# Patient Record
Sex: Female | Born: 1980 | Race: White | Hispanic: No | Marital: Married | Smoking: Never smoker
Health system: Southern US, Community
[De-identification: ages and names within clinical notes are randomized; demographics above are authoritative.]

## PROBLEM LIST (undated history)

## (undated) DIAGNOSIS — N920 Excessive and frequent menstruation with regular cycle: Secondary | ICD-10-CM

## (undated) DIAGNOSIS — Z9109 Other allergy status, other than to drugs and biological substances: Secondary | ICD-10-CM

## (undated) DIAGNOSIS — F53 Postpartum depression: Secondary | ICD-10-CM

## (undated) DIAGNOSIS — D649 Anemia, unspecified: Secondary | ICD-10-CM

## (undated) DIAGNOSIS — D259 Leiomyoma of uterus, unspecified: Secondary | ICD-10-CM

## (undated) DIAGNOSIS — E785 Hyperlipidemia, unspecified: Secondary | ICD-10-CM

## (undated) DIAGNOSIS — K219 Gastro-esophageal reflux disease without esophagitis: Secondary | ICD-10-CM

## (undated) DIAGNOSIS — F3281 Premenstrual dysphoric disorder: Secondary | ICD-10-CM

## (undated) DIAGNOSIS — F419 Anxiety disorder, unspecified: Secondary | ICD-10-CM

## (undated) DIAGNOSIS — T7421XA Adult sexual abuse, confirmed, initial encounter: Secondary | ICD-10-CM

## (undated) DIAGNOSIS — T8859XA Other complications of anesthesia, initial encounter: Secondary | ICD-10-CM

## (undated) DIAGNOSIS — F32A Depression, unspecified: Secondary | ICD-10-CM

## (undated) DIAGNOSIS — G43909 Migraine, unspecified, not intractable, without status migrainosus: Secondary | ICD-10-CM

## (undated) HISTORY — DX: Other allergy status, other than to drugs and biological substances: Z91.09

## (undated) HISTORY — DX: Gastro-esophageal reflux disease without esophagitis: K21.9

## (undated) HISTORY — PX: SEPTOPLASTY: SUR1290

## (undated) HISTORY — PX: WRIST SURGERY: SHX841

---

## 2006-03-09 DIAGNOSIS — R87629 Unspecified abnormal cytological findings in specimens from vagina: Secondary | ICD-10-CM

## 2006-03-09 HISTORY — DX: Unspecified abnormal cytological findings in specimens from vagina: R87.629

## 2009-07-25 ENCOUNTER — Ambulatory Visit: Payer: Self-pay | Admitting: Pediatrics

## 2013-05-16 ENCOUNTER — Ambulatory Visit: Payer: Self-pay | Admitting: Family Medicine

## 2014-06-14 ENCOUNTER — Ambulatory Visit: Admit: 2014-06-14 | Disposition: A | Discharge status: Home / Self Care | Payer: Self-pay

## 2016-02-06 ENCOUNTER — Ambulatory Visit (INDEPENDENT_AMBULATORY_CARE_PROVIDER_SITE_OTHER): Payer: 59 | Admitting: Obstetrics and Gynecology

## 2016-02-06 ENCOUNTER — Encounter: Payer: Self-pay | Admitting: Obstetrics and Gynecology

## 2016-02-06 VITALS — BP 134/84 | HR 73 | Ht 65.0 in | Wt 169.3 lb

## 2016-02-06 DIAGNOSIS — Z22330 Carrier of Group B streptococcus: Secondary | ICD-10-CM

## 2016-02-06 DIAGNOSIS — E663 Overweight: Secondary | ICD-10-CM | POA: Insufficient documentation

## 2016-02-06 DIAGNOSIS — Z842 Family history of other diseases of the genitourinary system: Secondary | ICD-10-CM

## 2016-02-06 DIAGNOSIS — Z8742 Personal history of other diseases of the female genital tract: Secondary | ICD-10-CM | POA: Insufficient documentation

## 2016-02-06 DIAGNOSIS — Z30432 Encounter for removal of intrauterine contraceptive device: Secondary | ICD-10-CM

## 2016-02-06 MED ORDER — CONCEPT DHA 53.5-38-1 MG PO CAPS: 53.5000 mg | ORAL_CAPSULE | Freq: Every day | ORAL | 3 refills | Status: DC

## 2016-02-06 NOTE — Progress Notes (Signed)
GYN ENCOUNTER NOTE  Subjective:       Sheila Baker is a 35 y.o. [id number] female is here for gynecologic evaluation of the following issues:  1. Establish care 2. IUD removal  Desires conception. History of dysmenorrhea and menorrhagia. Dysmenorrhea is characterized with central pelvic pain as well as low back pain and radiation into the buttocks and thighs History of dyspareunia   Family history of endometriosis in grandmother  Past OB history notable for 2 spontaneous vaginal deliveries. G1-PROM at 36 weeks; 6-1/2 pound female G2-SVD at 39 weeks; 8 lbs. 9 oz. female Both pregnancies, complicated by GBS positive status   Gynecologic History No LMP recorded (lmp unknown). Contraception: IUD Last Pap: No abnormals Last mammogram: N/A  Obstetric History OB History  Gravida Para Term Preterm AB Living  2 2 2     2   SAB TAB Ectopic Multiple Live Births          2    # Outcome Date GA Lbr Len/2nd Weight Sex Delivery Anes PTL Lv  2 Term 2013   8 lb 14.4 oz (4.037 kg) M Vag-Spont   LIV  1 Term 2011   6 lb 8 oz (2.948 kg) F Vag-Spont   LIV      Past Medical History:  Diagnosis Date  . Acid reflux   . Environmental allergies     Past Surgical History:  Procedure Laterality Date  . SEPTOPLASTY    . WRIST SURGERY      No current outpatient prescriptions on file prior to visit.   No current facility-administered medications on file prior to visit.     Allergies  Allergen Reactions  . Shellfish Allergy Swelling    Swelling of mouth  . Gluten Meal Diarrhea  . Sulfa Antibiotics Other (See Comments)    Fatigue    Social History   Social History  . Marital status: Married    Spouse name: N/A  . Number of children: N/A  . Years of education: N/A   Occupational History  . Not on file.   Social History Main Topics  . Smoking status: Never Smoker  . Smokeless tobacco: Never Used  . Alcohol use Yes     Comment: occas  . Drug use: No  . Sexual activity: Yes   Birth control/ protection: IUD   Other Topics Concern  . Not on file   Social History Narrative  . No narrative on file    Family History  Problem Relation Age of Onset  . Diabetes Maternal Grandmother   . Heart disease Maternal Grandmother   . Heart disease Maternal Grandfather   . Breast cancer Paternal Grandmother   . Ovarian cancer Neg Hx   . Colon cancer Neg Hx     The following portions of the patient's history were reviewed and updated as appropriate: allergies, current medications, past family history, past medical history, past social history, past surgical history and problem list.  Review of Systems Review of Systems - per history of present illness Objective:   BP 134/84   Pulse 73   Ht 5\' 5"  (1.651 m)   Wt 169 lb 4.8 oz (76.8 kg)   LMP  (LMP Unknown)   BMI 28.17 kg/m  CONSTITUTIONAL: Well-developed, well-nourished female in no acute distress.  HENT:  Normocephalic, atraumatic.  NECK: Not examined SKIN: Skin is warm and dry. No rash noted. Not diaphoretic. No erythema. No pallor. NEUROLGIC: Alert and oriented to person, place, and time. PSYCHIATRIC: Normal mood and  affect. Normal behavior. Normal judgment and thought content. CARDIOVASCULAR:Not Examined RESPIRATORY: Not Examined BREASTS: Not Examined ABDOMEN: Soft, non distended; Non tender.  No Organomegaly. PELVIC:  External Genitalia: Normal  BUS: Normal  Vagina: Normal  Cervix: Normal; Parous; no cervical motion tenderness; IUD string identified  Uterus: Normal size, shape,consistency, mobile, anteverted, nontender  Adnexa: Normal; nonpalpable and nontender  RV: Normal external exam  Bladder: Nontender MUSCULOSKELETAL: Normal range of motion. No tenderness.  No cyanosis, clubbing, or edema.     Assessment:   1. Encounter for removal of intrauterine contraceptive device (IUD)  2. Overweight (BMI 25.0-29.9)  3. History of dysmenorrhea  4. Family history of endometriosis  5. H/O female  dyspareunia     Plan:   1. Mirena IUD is removed 2. Begin prenatal vitamins 3. Consider attempting conception after at least 1 normal menstrual cycle following IUD removal 4. Healthy eating with exercises encouraged 5. Return for annual exam in 6 months  A total of 30 minutes were spent face-to-face with the patient during the encounter with greater than 50% dealing with counseling and coordination of care.  Herold HarmsMartin A Defrancesco, MD  Note: This dictation was prepared with Dragon dictation along with smaller phrase technology. Any transcriptional errors that result from this process are unintentional.

## 2016-02-06 NOTE — Patient Instructions (Signed)
1. IUD is removed today 2. Begin prenatal vitamins 3. Consider attempting conception after at least 1 normal menstrual cycle following IUD removal 4. Encourage healthy eating with exercise

## 2016-04-16 ENCOUNTER — Telehealth: Payer: Self-pay | Admitting: Obstetrics and Gynecology

## 2016-04-16 ENCOUNTER — Encounter: Payer: Self-pay | Admitting: Obstetrics and Gynecology

## 2016-04-16 ENCOUNTER — Ambulatory Visit (INDEPENDENT_AMBULATORY_CARE_PROVIDER_SITE_OTHER): Payer: 59

## 2016-04-16 ENCOUNTER — Ambulatory Visit (INDEPENDENT_AMBULATORY_CARE_PROVIDER_SITE_OTHER): Payer: 59 | Admitting: Obstetrics and Gynecology

## 2016-04-16 VITALS — BP 136/70 | HR 73 | Ht 63.0 in | Wt 168.4 lb

## 2016-04-16 DIAGNOSIS — N939 Abnormal uterine and vaginal bleeding, unspecified: Secondary | ICD-10-CM

## 2016-04-16 DIAGNOSIS — R102 Pelvic and perineal pain unspecified side: Secondary | ICD-10-CM

## 2016-04-16 DIAGNOSIS — O209 Hemorrhage in early pregnancy, unspecified: Secondary | ICD-10-CM

## 2016-04-16 DIAGNOSIS — L292 Pruritus vulvae: Secondary | ICD-10-CM

## 2016-04-16 DIAGNOSIS — N912 Amenorrhea, unspecified: Secondary | ICD-10-CM

## 2016-04-16 LAB — POCT URINALYSIS DIPSTICK
Bilirubin, UA: NEGATIVE
Blood, UA: NEGATIVE
Glucose, UA: NEGATIVE
Ketones, UA: NEGATIVE
LEUKOCYTES UA: NEGATIVE
NITRITE UA: NEGATIVE
PH UA: 6
PROTEIN UA: NEGATIVE
Spec Grav, UA: <=1.005
UROBILINOGEN UA: NEGATIVE

## 2016-04-16 LAB — POCT URINE PREGNANCY: PREG TEST UR: POSITIVE — AB

## 2016-04-16 NOTE — Patient Instructions (Addendum)
Minor Illnesses and Medications in Pregnancy  Cold/Flu:  Sudafed for congestion- Robitussin (plain) for cough- Tylenol for discomfort.  Please follow the directions on the label.  Try not to take any more than needed.  OTC Saline nasal spray and air humidifier or cool-mist  Vaporizer to sooth nasal irritation and to loosen congestion.  It is also important to increase intake of non carbonated fluids, especially if you have a fever.  Constipation:  Colace-2 capsules at bedtime; Metamucil- follow directions on label; Senokot- 1 tablet at bedtime.  Any one of these medications can be used.  It is also very important to increase fluids and fruits along with regular exercise.  If problem persists please call the office.  Diarrhea:  Kaopectate as directed on the label.  Eat a bland diet and increase fluids.  Avoid highly seasoned foods.  Headache:  Tylenol 1 or 2 tablets every 3-4 hours as needed  Indigestion:  Maalox, Mylanta, Tums or Rolaids- as directed on label.  Also try to eat small meals and avoid fatty, greasy or spicy foods.  Nausea with or without Vomiting:  Nausea in pregnancy is caused by increased levels of hormones in the body which influence the digestive system and cause irritation when stomach acids accumulate.  Symptoms usually subside after 1st trimester of pregnancy.  Try the following: 1. Keep saltines, graham crackers or dry toast by your bed to eat upon awakening. 2. Don't let your stomach get empty.  Try to eat 5-6 small meals per day instead of 3 large ones. 3. Avoid greasy fatty or highly seasoned foods.  4. Take OTC Unisom 1 tablet at bed time along with OTC Vitamin B6 25-50 mg 3 times per day.    If nausea continues with vomiting and you are unable to keep down food and fluids you may need a prescription medication.  Please notify your provider.   Sore throat:  Chloraseptic spray, throat lozenges and or plain Tylenol.  Vaginal Yeast Infection:  OTC Monistat for 7 days as  directed on label.  If symptoms do not resolve within a week notify provider.  If any of the above problems do not subside with recommended treatment please call the office for further assistance.   Do not take Aspirin, Advil, Motrin or Ibuprofen.  * * OTC= Over the counter First Trimester of Pregnancy The first trimester of pregnancy is from week 1 until the end of week 12 (months 1 through 3). A week after a sperm fertilizes an egg, the egg will implant on the wall of the uterus. This embryo will begin to develop into a baby. Genes from you and your partner are forming the baby. The female genes determine whether the baby is a boy or a girl. At 6-8 weeks, the eyes and face are formed, and the heartbeat can be seen on ultrasound. At the end of 12 weeks, all the baby's organs are formed.  Now that you are pregnant, you will want to do everything you can to have a healthy baby. Two of the most important things are to get good prenatal care and to follow your health care provider's instructions. Prenatal care is all the medical care you receive before the baby's birth. This care will help prevent, find, and treat any problems during the pregnancy and childbirth. BODY CHANGES Your body goes through many changes during pregnancy. The changes vary from woman to woman.   You may gain or lose a couple of pounds at first.  You  may feel sick to your stomach (nauseous) and throw up (vomit). If the vomiting is uncontrollable, call your health care provider.  You may tire easily.  You may develop headaches that can be relieved by medicines approved by your health care provider.  You may urinate more often. Painful urination may mean you have a bladder infection.  You may develop heartburn as a result of your pregnancy.  You may develop constipation because certain hormones are causing the muscles that push waste through your intestines to slow down.  You may develop hemorrhoids or swollen, bulging veins  (varicose veins).  Your breasts may begin to grow larger and become tender. Your nipples may stick out more, and the tissue that surrounds them (areola) may become darker.  Your gums may bleed and may be sensitive to brushing and flossing.  Dark spots or blotches (chloasma, mask of pregnancy) may develop on your face. This will likely fade after the baby is born.  Your menstrual periods will stop.  You may have a loss of appetite.  You may develop cravings for certain kinds of food.  You may have changes in your emotions from day to day, such as being excited to be pregnant or being concerned that something may go wrong with the pregnancy and baby.  You may have more vivid and strange dreams.  You may have changes in your hair. These can include thickening of your hair, rapid growth, and changes in texture. Some women also have hair loss during or after pregnancy, or hair that feels dry or thin. Your hair will most likely return to normal after your baby is born. WHAT TO EXPECT AT YOUR PRENATAL VISITS During a routine prenatal visit:  You will be weighed to make sure you and the baby are growing normally.  Your blood pressure will be taken.  Your abdomen will be measured to track your baby's growth.  The fetal heartbeat will be listened to starting around week 10 or 12 of your pregnancy.  Test results from any previous visits will be discussed. Your health care provider may ask you:  How you are feeling.  If you are feeling the baby move.  If you have had any abnormal symptoms, such as leaking fluid, bleeding, severe headaches, or abdominal cramping.  If you are using any tobacco products, including cigarettes, chewing tobacco, and electronic cigarettes.  If you have any questions. Other tests that may be performed during your first trimester include:  Blood tests to find your blood type and to check for the presence of any previous infections. They will also be used to  check for low iron levels (anemia) and Rh antibodies. Later in the pregnancy, blood tests for diabetes will be done along with other tests if problems develop.  Urine tests to check for infections, diabetes, or protein in the urine.  An ultrasound to confirm the proper growth and development of the baby.  An amniocentesis to check for possible genetic problems.  Fetal screens for spina bifida and Down syndrome.  You may need other tests to make sure you and the baby are doing well.  HIV (human immunodeficiency virus) testing. Routine prenatal testing includes screening for HIV, unless you choose not to have this test. HOME CARE INSTRUCTIONS  Medicines   Follow your health care provider's instructions regarding medicine use. Specific medicines may be either safe or unsafe to take during pregnancy.  Take your prenatal vitamins as directed.  If you develop constipation, try taking a stool  softener if your health care provider approves. Diet   Eat regular, well-balanced meals. Choose a variety of foods, such as meat or vegetable-based protein, fish, milk and low-fat dairy products, vegetables, fruits, and whole grain breads and cereals. Your health care provider will help you determine the amount of weight gain that is right for you.  Avoid raw meat and uncooked cheese. These carry germs that can cause birth defects in the baby.  Eating four or five small meals rather than three large meals a day may help relieve nausea and vomiting. If you start to feel nauseous, eating a few soda crackers can be helpful. Drinking liquids between meals instead of during meals also seems to help nausea and vomiting.  If you develop constipation, eat more high-fiber foods, such as fresh vegetables or fruit and whole grains. Drink enough fluids to keep your urine clear or pale yellow. Activity and Exercise   Exercise only as directed by your health care provider. Exercising will help you:  Control your  weight.  Stay in shape.  Be prepared for labor and delivery.  Experiencing pain or cramping in the lower abdomen or low back is a good sign that you should stop exercising. Check with your health care provider before continuing normal exercises.  Try to avoid standing for long periods of time. Move your legs often if you must stand in one place for a long time.  Avoid heavy lifting.  Wear low-heeled shoes, and practice good posture.  You may continue to have sex unless your health care provider directs you otherwise. Relief of Pain or Discomfort   Wear a good support bra for breast tenderness.   Take warm sitz baths to soothe any pain or discomfort caused by hemorrhoids. Use hemorrhoid cream if your health care provider approves.   Rest with your legs elevated if you have leg cramps or low back pain.  If you develop varicose veins in your legs, wear support hose. Elevate your feet for 15 minutes, 3-4 times a day. Limit salt in your diet. Prenatal Care   Schedule your prenatal visits by the twelfth week of pregnancy. They are usually scheduled monthly at first, then more often in the last 2 months before delivery.  Write down your questions. Take them to your prenatal visits.  Keep all your prenatal visits as directed by your health care provider. Safety   Wear your seat belt at all times when driving.  Make a list of emergency phone numbers, including numbers for family, friends, the hospital, and police and fire departments. General Tips   Ask your health care provider for a referral to a local prenatal education class. Begin classes no later than at the beginning of month 6 of your pregnancy.  Ask for help if you have counseling or nutritional needs during pregnancy. Your health care provider can offer advice or refer you to specialists for help with various needs.  Do not use hot tubs, steam rooms, or saunas.  Do not douche or use tampons or scented sanitary  pads.  Do not cross your legs for long periods of time.  Avoid cat litter boxes and soil used by cats. These carry germs that can cause birth defects in the baby and possibly loss of the fetus by miscarriage or stillbirth.  Avoid all smoking, herbs, alcohol, and medicines not prescribed by your health care provider. Chemicals in these affect the formation and growth of the baby.  Do not use any tobacco products, including cigarettes,  chewing tobacco, and electronic cigarettes. If you need help quitting, ask your health care provider. You may receive counseling support and other resources to help you quit.  Schedule a dentist appointment. At home, brush your teeth with a soft toothbrush and be gentle when you floss. SEEK MEDICAL CARE IF:   You have dizziness.  You have mild pelvic cramps, pelvic pressure, or nagging pain in the abdominal area.  You have persistent nausea, vomiting, or diarrhea.  You have a bad smelling vaginal discharge.  You have pain with urination.  You notice increased swelling in your face, hands, legs, or ankles. SEEK IMMEDIATE MEDICAL CARE IF:   You have a fever.  You are leaking fluid from your vagina.  You have spotting or bleeding from your vagina.  You have severe abdominal cramping or pain.  You have rapid weight gain or loss.  You vomit blood or material that looks like coffee grounds.  You are exposed to [place] measles and have never had them.  You are exposed to fifth disease or chickenpox.  You develop a severe headache.  You have shortness of breath.  You have any kind of trauma, such as from a fall or a car accident. This information is not intended to replace advice given to you by your health care provider. Make sure you discuss any questions you have with your health care provider. Document Released: 02/17/2001 Document Revised: 03/16/2014 Document Reviewed: 01/03/2013 Elsevier Interactive Patient Education  2017 [employer]  [employer] Commonly Asked Questions During Pregnancy  Cats: A parasite can be excreted in cat feces.  To avoid exposure you need to have another person empty the little box.  If you must empty the litter box you will need to wear gloves.  Wash your hands after handling your cat.  This parasite can also be found in raw or undercooked meat so this should also be avoided.  Colds, Sore Throats, Flu: Please check your medication sheet to see what you can take for symptoms.  If your symptoms are unrelieved by these medications please call the office.  Dental Work: Most any dental work Agricultural consultant recommends is permitted.  X-rays should only be taken during the first trimester if absolutely necessary.  Your abdomen should be shielded with a lead apron during all x-rays.  Please notify your provider prior to receiving any x-rays.  Novocaine is fine; gas is not recommended.  If your dentist requires a note from [place] prior to dental work please call the office and we will provide one for you.  Exercise: Exercise is an important part of staying healthy during your pregnancy.  You may continue most exercises you were accustomed to prior to pregnancy.  Later in your pregnancy you will most likely notice you have difficulty with activities requiring balance like riding a bicycle.  It is important that you listen to your body and avoid activities that put you at a higher risk of falling.  Adequate rest and staying well hydrated are a must!  If you have questions about the safety of specific activities ask your provider.    Exposure to Children with illness: Try to avoid obvious exposure; report any symptoms to [place] when noted,  If you have chicken pos, red measles or mumps, you should be immune to these diseases.   Please do not take any vaccines while pregnant unless you have checked with your OB provider.  Fetal Movement: After 28 weeks we recommend you do "kick counts" twice daily.  Lorenz Coaster  or sit down in a calm quiet  environment and count your baby movements "kicks".  You should feel your baby at least 10 times per hour.  If you have not felt 10 kicks within the first hour get up, walk around and have something sweet to eat or drink then repeat for an additional hour.  If count remains less than 10 per hour notify your provider.  Fumigating: Follow your pest control agent's advice as to how long to stay out of your home.  Ventilate the area well before re-entering.  Hemorrhoids:   Most over-the-counter preparations can be used during pregnancy.  Check your medication to see what is safe to use.  It is important to use a stool softener or fiber in your diet and to drink lots of liquids.  If hemorrhoids seem to be getting worse please call the office.   Hot Tubs:  Hot tubs Jacuzzis and saunas are not recommended while pregnant.  These increase your internal body temperature and should be avoided.  Intercourse:  Sexual intercourse is safe during pregnancy as long as you are comfortable, unless otherwise advised by your provider.  Spotting may occur after intercourse; report any bright red bleeding that is heavier than spotting.  Labor:  If you know that you are in labor, please go to the hospital.  If you are unsure, please call the office and let us help you decide what to do.  Lifting, straining, etc:  If your job requires heavy lifting or straining please check with your provider for any limitations.  Generally, you should not lift items heavier than that you can lift simply with your hands and arms (no back muscles)  Painting:  Paint fumes do not harm your pregnancy, but may make you ill and should be avoided if possible.  Latex or water based paints have less odor than oils.  Use adequate ventilation while painting.  Permanents & Hair Color:  Chemicals in hair dyes are not recommended as they cause increase hair dryness which can increase hair loss during pregnancy.  " Highlighting" and permanents are allowed.   Dye may be absorbed differently and permanents may not hold as well during pregnancy.  Sunbathing:  Use a sunscreen, as skin burns easily during pregnancy.  Drink plenty of fluids; avoid over heating.  Tanning Beds:  Because their possible side effects are still unknown, tanning beds are not recommended.  Ultrasound Scans:  Routine ultrasounds are performed at approximately 20 weeks.  You will be able to see your baby's general anatomy an if you would like to know the gender this can usually be determined as well.  If it is questionable when you conceived you may also receive an ultrasound early in your pregnancy for dating purposes.  Otherwise ultrasound exams are not routinely performed unless there is a medical necessity.  Although you can request a scan we ask that you pay for it when conducted because insurance does not cover " patient request" scans.  Work: If your pregnancy proceeds without complications you may work until your due date, unless your physician or employer advises otherwise.  Round Ligament Pain/Pelvic Discomfort:  Sharp, shooting pains not associated with bleeding are fairly common, usually occurring in the second trimester of pregnancy.  They tend to be worse when standing up or when you remain standing for long periods of time.  These are the result of pressure of certain pelvic ligaments called "round ligaments".  Rest, Tylenol and heat seem to be the  most effective relief.  As the womb and fetus grow, they rise out of the pelvis and the discomfort improves.  Please notify the office if your pain seems different than that described.  It may represent a more serious condition.  RECOMMENDATIONS: 1. Return as needed for any recurrent vaginal bleeding like a period or worse 2. Return as needed for any worsening of pelvic pain 3. Testing for yeast infection is performed today; results will be made available 4. Ultrasound is to be done today to confirm intrauterine pregnancy

## 2016-04-16 NOTE — Telephone Encounter (Signed)
PT CALLED AND SHE IS ABOUT [redacted] WEEKS PREGNANT SHE HAS HER CONFIRMATION WITH DR DE NEXT WEEK, HOWEVER SHE IS AN ESTABLISHED PT HERE WITH DR DE. SHE SAID LAST NIGHT SHE WENT TO WIPE AND IT WAS SOME SPOTTING OF BRIGHT RED AND THEN WHEN SHE WIPED AGAIN SHE SAID IT WAS A PINK[place] BROWN TINT. SHE IS COMPLAINING OF LOWER BACK PAIN AND CRAMPING FEELING. BURNING SENSATION. SHE DID HAVE SOME ITCHING. I WASN'T SURE IF SHE NEEDED AND APT OR IF IT NEEDED TO BE A URINE DROP OFF. PT WOULD LIKE A CALL BACK

## 2016-04-16 NOTE — Progress Notes (Signed)
NEW OB HISTORY AND PHYSICAL  SUBJECTIVE:       Sheila Baker is a 36 y.o. G77P2002 female, Patient's last menstrual period was 03/04/2016 (exact date)., Estimated Date of Delivery: EDD= 12/09/2016 presents today for new OB and recent spotting.    1.  New Ob: First Trimester; [redacted] weeks pregnant:   LMP 03/04/16; EDD 12/09/2016 2.  1st trimester bleeding: last night. Spotting transitioned from bright red -> pink-> brown discharge. No bleeding today.  3.  Pelvic Cramping/ Back pain: with onset of spotting. Only back pain present today. 4.  Vulvar pruritis /burning - X3 weeks; improving.  5.  Blood type A+   H/o past yeast infections   Ddx :Threatened abortion, ectopic, intrauterine, missed, vaginitis, endocervical polyp,    Gynecologic History Patient's last menstrual period was 03/04/2016 (exact date). Normal. Contraception: none. S/p IUD removal.  Obstetric History OB History  Gravida Para Term Preterm AB Living  3 2 2     2   SAB TAB Ectopic Multiple Live Births          2    # Outcome Date GA Lbr Len/2nd Weight Sex Delivery Anes PTL Lv  3 Current           2 Term 2013   8 lb 14.4 oz (4.037 kg) M Vag-Spont   LIV  1 Term 2011   6 lb 8 oz (2.948 kg) F Vag-Spont   LIV      Past Medical History:  Diagnosis Date  . Acid reflux   . Environmental allergies     Past Surgical History:  Procedure Laterality Date  . SEPTOPLASTY    . WRIST SURGERY      Current Outpatient Prescriptions on File Prior to Visit  Medication Sig Dispense Refill  . Lactobacillus (PROBIOTIC ACIDOPHILUS PO) Take by mouth.    . Prenat-FeFum-FePo-FA-Omega 3 (CONCEPT DHA) 53.5-38-1 MG CAPS Take 53.5 mg by mouth daily. 90 capsule 3   No current facility-administered medications on file prior to visit.     Allergies  Allergen Reactions  . Shellfish Allergy Swelling    Swelling of mouth  . Gluten Meal Diarrhea  . Sulfa Antibiotics Other (See Comments)    Fatigue    Social History   Social History   . Marital status: Married    Spouse name: N/A  . Number of children: N/A  . Years of education: N/A   Occupational History  . Not on file.   Social History Main Topics  . Smoking status: Never Smoker  . Smokeless tobacco: Never Used  . Alcohol use Yes     Comment: occas  . Drug use: No  . Sexual activity: Yes    Birth control/ protection: None   Other Topics Concern  . Not on file   Social History Narrative  . No narrative on file    Family History  Problem Relation Age of Onset  . Diabetes Maternal Grandmother   . Heart disease Maternal Grandmother   . Heart disease Maternal Grandfather   . Breast cancer Paternal Grandmother   . Ovarian cancer Neg Hx   . Colon cancer Neg Hx     The following portions of the patient's history were reviewed and updated as appropriate: allergies, current medications, past OB history, past medical history, past surgical history, past family history, past social history, and problem list.    OBJECTIVE: Initial Physical Exam (New OB)  GENERAL APPEARANCE: alert, well appearing, in no apparent distress, oriented to person, place  and time, well hydrated HEAD: normocephalic, atraumatic MOUTH: mucous membranes moist, pharynx normal without lesions and dental hygiene good THYROID: no thyromegaly or masses present BREASTS: tender LUNGS: clear to auscultation, no wheezes, rales or rhonchi, symmetric air entry HEART: regular rate and rhythm, no murmurs ABDOMEN: soft, nontender, nondistended, no abnormal masses, no epigastric pain. No pain with percussion. No rebound tenderness noted on exam.  EXTREMITIES: no redness or tenderness in the calves or thighs. No edema SKIN: normal coloration and turgor, no rashes LYMPH NODES: no adenopathy palpable NEUROLOGIC: alert, oriented, normal speech, no focal findings or movement disorder noted  PELVIC EXAM EXTERNAL GENITALIA: normal appearing vulva with no masses, tenderness or lesions. Eversion noted on  exam with areas of spotting and closed cervix. Tender cervical movement. Uterus enlarged appropriately for 5-[redacted] week gestational age.  Globular and soft. No adnexa abnormalities noted on exam.   ASSESSMENT: Threatened spontaneous abortion A+ blood type Suspect bleeding to be coming from the cervical eversion.  1. Intrauterine pregnancy - 5 weeks.   2.  Cervicitis - spotting, eversion. Closed cervix. 3.  Pelvic Cramping 4.  Vulvar pruritis - r/o yeast and BV 5.  A+ blood - no Rhogam needed    PLAN: 1.  Prenatal care 2.  R/o vaginitis - BV / yeast - NuSwab performed today, results available in 2 days 3.  R/u UTI 4.  RTC for any increased vaginal bleeding 5.  RTC for any worsening pelvic pain 6.  US is to be done today to confirm intrauterine pregnancy 7.  RTC for prenatal visits 8. F/u early Tuesday 9. Quantitative hCG every 48 hours 2  A total of 15 minutes were spent face-to-face with the patient during this encounter and over half of that time dealt with counseling and coordination of care.  Marisue IvanJacquelyn Visser, PA-S Herold HarmsMartin A Courteney Alderete, MD   I have seen, interviewed, and examined the patient in conjunction with the [employer] and affirm the diagnosis and management plan. Jakolby Sedivy A. Farah Lepak, MD, FACOG   Note: This dictation was prepared with Dragon dictation along with smaller phrase technology. Any transcriptional errors that result from this process are unintentional.

## 2016-04-16 NOTE — Telephone Encounter (Signed)
Pt states last nite she had some bright red bleeding that went from a pink tinge to brown in color. Nothing at present. Pos back pain and cramps. She does have a d/c which is clear and no odor. Pos itching. 2 weeks ago she had a  pos hpt. lmp 03/04/2016 edd 12/09/2016 6 1/7. Pos n/v. Pos breast tenderness. Appt made with mad today at 1:30.

## 2016-04-17 LAB — BETA HCG QUANT (REF LAB): hCG Quant: 265 m[IU]/mL

## 2016-04-17 LAB — URINE CULTURE: Organism ID, Bacteria: NO GROWTH

## 2016-04-18 ENCOUNTER — Other Ambulatory Visit
Admission: AD | Admit: 2016-04-18 | Discharge: 2016-04-18 | Disposition: A | Discharge status: Home / Self Care | Payer: 59 | Source: Ambulatory Visit | Attending: Obstetrics and Gynecology | Admitting: Obstetrics and Gynecology

## 2016-04-18 DIAGNOSIS — Z3A00 Weeks of gestation of pregnancy not specified: Secondary | ICD-10-CM | POA: Insufficient documentation

## 2016-04-18 DIAGNOSIS — O209 Hemorrhage in early pregnancy, unspecified: Secondary | ICD-10-CM | POA: Diagnosis not present

## 2016-04-18 LAB — HCG, QUANTITATIVE, PREGNANCY: hCG, Beta Chain, Quant, S: 410 m[IU]/mL — ABNORMAL HIGH (ref <5)

## 2016-04-20 ENCOUNTER — Telehealth: Payer: Self-pay

## 2016-04-20 LAB — NUSWAB BV AND CANDIDA, NAA
Atopobium vaginae: HIGH Score — AB
CANDIDA ALBICANS, NAA: NEGATIVE
CANDIDA GLABRATA, NAA: NEGATIVE

## 2016-04-21 ENCOUNTER — Ambulatory Visit (INDEPENDENT_AMBULATORY_CARE_PROVIDER_SITE_OTHER): Payer: 59 | Admitting: Obstetrics and Gynecology

## 2016-04-21 ENCOUNTER — Encounter: Payer: Self-pay | Admitting: Obstetrics and Gynecology

## 2016-04-21 VITALS — BP 132/78 | HR 79 | Ht 63.0 in | Wt 171.1 lb

## 2016-04-21 DIAGNOSIS — N939 Abnormal uterine and vaginal bleeding, unspecified: Secondary | ICD-10-CM | POA: Diagnosis not present

## 2016-04-21 DIAGNOSIS — R102 Pelvic and perineal pain: Secondary | ICD-10-CM

## 2016-04-21 DIAGNOSIS — O209 Hemorrhage in early pregnancy, unspecified: Secondary | ICD-10-CM | POA: Diagnosis not present

## 2016-04-21 NOTE — Patient Instructions (Signed)
1. Quantitative hCG is obtained today 2. Keep ultrasound appointment as scheduled on 04/30/2016 3. Return if vaginal bleeding worsens or if pelvic cramping becomes more severe   Threatened Miscarriage A threatened miscarriage occurs when you have vaginal bleeding during your first 20 weeks of pregnancy but the pregnancy has not ended. If you have vaginal bleeding during this time, your health care provider will do tests to make sure you are still pregnant. If the tests show you are still pregnant and the developing baby (fetus) inside your womb (uterus) is still growing, your condition is considered a threatened miscarriage. A threatened miscarriage does not mean your pregnancy will end, but it does increase the risk of losing your pregnancy (complete miscarriage). What are the causes? The cause of a threatened miscarriage is usually not known. If you go on to have a complete miscarriage, the most common cause is an abnormal number of chromosomes in the developing baby. Chromosomes are the structures inside cells that hold all your genetic material. Some causes of vaginal bleeding that do not result in miscarriage include:  Having sex.  Having an infection.  Normal hormone changes of pregnancy.  Bleeding that occurs when an egg implants in your uterus. What increases the risk? Risk factors for bleeding in early pregnancy include:  Obesity.  Smoking.  Drinking excessive amounts of alcohol or caffeine.  Recreational drug use. What are the signs or symptoms?  Light vaginal bleeding.  Mild abdominal pain or cramps. How is this diagnosed? If you have bleeding with or without abdominal pain before 20 weeks of pregnancy, your health care provider will do tests to check whether you are still pregnant. One important test involves using sound waves and a computer (ultrasound) to create images of the inside of your uterus. Other tests include an internal exam of your vagina and uterus (pelvic  exam) and measurement of your baby's heart rate. You may be diagnosed with a threatened miscarriage if:  Ultrasound testing shows you are still pregnant.  Your baby's heart rate is strong.  A pelvic exam shows that the opening between your uterus and your vagina (cervix) is closed.  Your heart rate and blood pressure are stable.  Blood tests confirm you are still pregnant. How is this treated? No treatments have been shown to prevent a threatened miscarriage from going on to a complete miscarriage. However, the right home care is important. Follow these instructions at home:  Make sure you keep all your appointments for prenatal care. This is very important.  Get plenty of rest.  Do not have sex or use tampons if you have vaginal bleeding.  Do not douche.  Do not smoke or use recreational drugs.  Do not drink alcohol.  Avoid caffeine. Contact a health care provider if:  You have light vaginal bleeding or spotting while pregnant.  You have abdominal pain or cramping.  You have a fever. Get help right away if:  You have heavy vaginal bleeding.  You have blood clots coming from your vagina.  You have severe low back pain or abdominal cramps.  You have fever, chills, and severe abdominal pain. This information is not intended to replace advice given to you by your health care provider. Make sure you discuss any questions you have with your health care provider. Document Released: 02/23/2005 Document Revised: 08/01/2015 Document Reviewed: 12/20/2012 Elsevier Interactive Patient Education  2017 [employer]

## 2016-04-21 NOTE — Progress Notes (Signed)
Chief complaint: 1. Threatened abortion  Patient presents today for follow-up. She is having some mild vaginal bloody discharge along with some increased backache and central cramps. No passage of tissue.  Quantitative hCG titers: 04/16/2016 265 04/18/2016 410  No ectopic risk factors except for possible history of endometriosis  Past medical history, past surgical history, problem list, medications, and allergies are reviewed  OBJECTIVE: BP 132/78   Pulse 79   Ht 5\' 3"  (1.6 m)   Wt 171 lb 1.6 oz (77.6 kg)   LMP 03/04/2016 (Exact Date)   BMI 30.31 kg/m  Pleasant white female in no acute distress. Alert and oriented. Abdomen: Off, nontender Pelvic exam: External genitalia-normal BUS-normal Vagina-bloody mucus in vault Cervix-eversion present with minimal blood coming from friable cervix; os is closed Uterus-6 weeks size, globular, nontender Adnexa-nonpalpable nontender  ASSESSMENT: 1. Threatened abortion in first trimester 2. Probable adequate quantitative ACG titer rises but with ongoing symptoms of low backache and central cramping 3. Doubt ectopic  PLAN: 1. Repeat quantitative hCG today 2. Keep ultrasound appointment as scheduled for 04/30/2016 3. Return if bleeding increases or pelvic cramping increases.  A total of 15 minutes were spent face-to-face with the patient during this encounter and over half of that time dealt with counseling and coordination of care.  Herold HarmsMartin A Lyrick Lagrand, MD  Note: This dictation was prepared with Dragon dictation along with smaller phrase technology. Any transcriptional errors that result from this process are unintentional.

## 2016-04-21 NOTE — Telephone Encounter (Signed)
error 

## 2016-04-22 ENCOUNTER — Telehealth: Payer: Self-pay

## 2016-04-22 ENCOUNTER — Other Ambulatory Visit: Payer: 59

## 2016-04-22 NOTE — Telephone Encounter (Signed)
Pt states her bleeding has picked up. She has seen small clots. Mild cramps. Did repeat beta this am. Pt advised if she starts bleeding like a period or having severe cramps she is to go to the er. Pt advised to be here in the am at 9:30.

## 2016-04-23 ENCOUNTER — Ambulatory Visit (INDEPENDENT_AMBULATORY_CARE_PROVIDER_SITE_OTHER): Payer: 59

## 2016-04-23 ENCOUNTER — Encounter: Payer: 59 | Admitting: Obstetrics and Gynecology

## 2016-04-23 ENCOUNTER — Encounter: Payer: Self-pay | Admitting: Obstetrics and Gynecology

## 2016-04-23 ENCOUNTER — Ambulatory Visit (INDEPENDENT_AMBULATORY_CARE_PROVIDER_SITE_OTHER): Payer: 59 | Admitting: Obstetrics and Gynecology

## 2016-04-23 ENCOUNTER — Encounter
Admission: RE | Admit: 2016-04-23 | Discharge: 2016-04-23 | Disposition: A | Discharge status: Home / Self Care | Payer: 59 | Source: Ambulatory Visit | Attending: Obstetrics and Gynecology | Admitting: Obstetrics and Gynecology

## 2016-04-23 VITALS — BP 141/78 | HR 83 | Ht 63.0 in | Wt 167.5 lb

## 2016-04-23 DIAGNOSIS — O209 Hemorrhage in early pregnancy, unspecified: Secondary | ICD-10-CM | POA: Diagnosis not present

## 2016-04-23 DIAGNOSIS — O034 Incomplete spontaneous abortion without complication: Secondary | ICD-10-CM | POA: Diagnosis not present

## 2016-04-23 DIAGNOSIS — R102 Pelvic and perineal pain: Secondary | ICD-10-CM

## 2016-04-23 DIAGNOSIS — Z01818 Encounter for other preprocedural examination: Secondary | ICD-10-CM | POA: Diagnosis present

## 2016-04-23 LAB — CBC WITH DIFFERENTIAL/PLATELET
Basophils Absolute: 0 10*3/uL (ref 0–0.1)
Basophils Relative: 0 %
Eosinophils Absolute: 0.1 10*3/uL (ref 0–0.7)
Eosinophils Relative: 1 %
HEMATOCRIT: 39.1 % (ref 35.0–47.0)
Hemoglobin: 13.1 g/dL (ref 12.0–16.0)
LYMPHS ABS: 2.1 10*3/uL (ref 1.0–3.6)
LYMPHS PCT: 18 %
MCH: 29.5 pg (ref 26.0–34.0)
MCHC: 33.6 g/dL (ref 32.0–36.0)
MCV: 87.6 fL (ref 80.0–100.0)
MONO ABS: 0.7 10*3/uL (ref 0.2–0.9)
Monocytes Relative: 7 %
NEUTROS ABS: 8.4 10*3/uL — AB (ref 1.4–6.5)
Neutrophils Relative %: 74 %
Platelets: 280 10*3/uL (ref 150–440)
RBC: 4.46 MIL/uL (ref 3.80–5.20)
RDW: 13.2 % (ref 11.5–14.5)
WBC: 11.3 10*3/uL — ABNORMAL HIGH (ref 3.6–11.0)

## 2016-04-23 LAB — TYPE AND SCREEN
ABO/RH(D): A POS
Antibody Screen: NEGATIVE
Extend sample reason: UNDETERMINED

## 2016-04-23 LAB — RAPID HIV SCREEN (HIV 1/2 AB+AG)
HIV 1/2 Antibodies: NONREACTIVE
HIV-1 P24 Antigen - HIV24: NONREACTIVE

## 2016-04-23 LAB — BETA HCG QUANT (REF LAB): hCG Quant: 585 m[IU]/mL

## 2016-04-23 NOTE — Patient Instructions (Signed)
1. Return for ultrasound this afternoon at 2 PM 2. Preoperative appointment today-anesthesia 3. Surgery is scheduled for tomorrow morning at 9 AM 4. Return in 1 week after surgery for postop appointment check

## 2016-04-23 NOTE — Progress Notes (Signed)
Chief complaint: 1. Threatened abortion 2. Persistent bleeding and cramping  Patient presents for follow-up on quantitative hCG titers. Review of titers demonstrates a suboptimal trend. She is having worsening cramping in lower back along with increased burgundy blood without passage of tissue from the vagina. She is interested in active management of this problem.  OBJECTIVE: BP (!) 141/78   Pulse 83   Ht 5\' 3"  (1.6 m)   Wt 167 lb 8 oz (76 kg)   LMP 03/04/2016 (Exact Date)   BMI 29.67 kg/m  Pleasant female in no acute distress. Slightly anxious. Abdomen: Soft, nontender without organomegaly; no peritoneal signs Pelvic exam: External genitalia-normal BUS normal Vagina-with burgundy blood coming from the cervical os; os appears closed; Uterus-anteverted mobile top normal size, tender 1/4 Adnexa-right adnexa tender 1/4; left adnexa nontender Rectovaginal-normal external exam   ASSESSMENT: 1. Threatened abortion with suboptimal hCG rise 2. Doubt ectopic 3. A+ blood type  PLAN: 1. Repeat ultrasound today 2. Scheduled for suction D&C on 04/24/2016 3. Referred for preop appointment later today. 4. Preop H&P completed  A total of 15 minutes were spent face-to-face with the patient during this encounter and over half of that time dealt with counseling and coordination of care.  Herold HarmsMartin A Ruhaan Nordahl, MD Note: This dictation was prepared with Dragon dictation along with smaller phrase technology. Any transcriptional errors that result from this process are unintentional.

## 2016-04-23 NOTE — H&P (Signed)
Subjective:  PREOPERATIVE HISTORY AND PHYSICAL   Date of surgery: 04/24/2016 Procedure: Suction D&C Diagnosis: Incomplete spontaneous abortion   Patient is a [age] y.o. [id number] scheduled for suction D&C for spontaneous abortion. Patient is experiencing low backache with pelvic cramping and chronic vaginal bleeding/discharge without passage of tissue. Ultrasound on 04/16/2016 demonstrated no intrauterine pregnancy; endometrial thickness was 11.4 mm; corpus luteum was present on the right Ultrasound on 04/23/2016 demonstrated An empty uterus with endometrial stripe measuring 7 mm; no adnexal masses or free fluid; right corpus luteum cyst was again seen (patient past small fragment of tissue prior to ultrasound) Preoperative quantitative hCG titers have been suboptimal: 265-04/16/2016 410-02/10 2018 585-04/22/2016  Blood type A+  Gynecologic History LMP 03/04/2016 Contraception: IUD Last Pap: No abnormals Last mammogram: N/A   Menstrual History: OB History    Gravida Para Term Preterm AB Living   3 2 2     2    SAB TAB Ectopic Multiple Live Births           2     Patient's last menstrual period was 03/04/2016 (exact date).    Past Medical History:  Diagnosis Date  . Acid reflux   . Environmental allergies     Past Surgical History:  Procedure Laterality Date  . SEPTOPLASTY    . WRIST SURGERY      OB History  Gravida Para Term Preterm AB Living  3 2 2     2   SAB TAB Ectopic Multiple Live Births          2    # Outcome Date GA Lbr Len/2nd Weight Sex Delivery Anes PTL Lv  3 Current           2 Term 2013   8 lb 14.4 oz (4.037 kg) M Vag-Spont   LIV  1 Term 2011   6 lb 8 oz (2.948 kg) F Vag-Spont   LIV      Social History   Social History  . Marital status: Married    Spouse name: N/A  . Number of children: N/A  . Years of education: N/A   Social History Main Topics  . Smoking status: Never Smoker  . Smokeless tobacco: Never Used  . Alcohol use Yes   Comment: occas  . Drug use: No  . Sexual activity: Yes    Birth control/ protection: None   Other Topics Concern  . None   Social History Narrative  . None    Family History  Problem Relation Age of Onset  . Diabetes Maternal Grandmother   . Heart disease Maternal Grandmother   . Heart disease Maternal Grandfather   . Breast cancer Paternal Grandmother   . Ovarian cancer Neg Hx   . Colon cancer Neg Hx      (Not in a hospital admission)  Allergies  Allergen Reactions  . Shellfish Allergy Swelling    Swelling of mouth  . Gluten Meal Diarrhea  . Sulfa Antibiotics Other (See Comments)    Fatigue    Review of Systems Constitutional: No recent fever/chills/sweats Respiratory: No recent cough/bronchitis Cardiovascular: No chest pain Gastrointestinal: No recent nausea/vomiting/diarrhea Genitourinary: No UTI symptoms Hematologic/lymphatic:No history of coagulopathy or recent blood thinner use    Objective:    BP (!) 141/78   Pulse 83   Ht 5\' 3"  (1.6 m)   Wt 167 lb 8 oz (76 kg)   LMP 03/04/2016 (Exact Date)   BMI 29.67 kg/m   General:   Normal  Skin:  normal  HEENT:  Normal  Neck:  Supple without Adenopathy or Thyromegaly  Lungs:   Heart:              Breasts:   Abdomen:  Pelvis:  M/S   Extremeties:  Neuro:    clear to auscultation bilaterally   Normal without murmur   Not Examined   soft, non-tender; bowel sounds normal; no masses,  no organomegaly   Exam deferred to OR  No CVAT  Warm/Dry   Normal       Pelvic exam: 04/26/2016 External genitalia-normal BUS-normal Vagina-bloody mucus in vault Vagina-Burgundy blood coming from os; os is closed Uterus-6 weeks size, globular, slightly tender 1/4 Adnexa-Right lower quadrant slightly tender 1/4 without palpable mass  Assessment:     1. Spontaneous abortion, incomplete 2. A+ blood type   Plan:  Suction D&C    Preop counseling: Patient is to undergo suction D&C on 04/24/2016 for incomplete  abortion. She is understanding of the planned procedure and is aware of and is accepting of all surgical risks which include but are not limited to bleeding, infection, pelvic organ injury with need for repair, blood clot disorders, anesthesia risks, etc. All questions have been answered. Informed consent is given. Patient is ready and willing to proceed with surgery as scheduled.  Herold Harms, MD  Note: This dictation was prepared with Dragon dictation along with smaller phrase technology. Any transcriptional errors that result from this process are unintentional.

## 2016-04-23 NOTE — Patient Instructions (Signed)
Your procedure is scheduled on: Friday 04/24/16 arrive at 8:00 Report to DAY SURGERY. 2ND FLOOR MEDICAL MALL ENTRANCE. To find out your arrival time please call [phone] between 1PM - 3PM on .  Remember: Instructions that are not followed completely may result in serious medical risk, up to and including death, or upon the discretion of your surgeon and anesthesiologist your surgery may need to be rescheduled.    __X__ 1. Do not eat food or drink liquids after midnight. No gum chewing or hard candies.     __X__ 2. No Alcohol for 24 hours before or after surgery.   ____ 3. Bring all medications with you on the day of surgery if instructed.    __X__ 4. Notify your doctor if there is any change in your medical condition     (cold, fever, infections).             ___x__5. No smoking within 24 hours of your surgery.     Do not wear jewelry, make-up, hairpins, clips or nail polish.  Do not wear lotions, powders, or perfumes.   Do not shave 48 hours prior to surgery. Men may shave face and neck.  Do not bring valuables to the hospital.    [facility] is not responsible for any belongings or valuables.               Contacts, dentures or bridgework may not be worn into surgery.  Leave your suitcase in the car. After surgery it may be brought to your room.  For patients admitted to the hospital, discharge time is determined by your                treatment team.   Patients discharged the day of surgery will not be allowed to drive home.   Please read over the following fact sheets that you were given:   Pain Booklet and MRSA Information   ____ Take these medicines the morning of surgery with A SIP OF WATER:    1. none  2.   3.   4.  5.  6.  ____ Fleet Enema (as directed)   ____ Use CHG Soap as directed  ____ Use inhalers on the day of surgery  ____ Stop metformin 2 days prior to surgery    ____ Take 1/2 of usual insulin dose the night before surgery and none on the morning  of surgery.   ____ Stop Coumadin/Plavix/aspirin on   __X__ Stop Anti-inflammatories such as Advil, Aleve, Ibuprofen, Motrin, Naproxen, Naprosyn, Goodies,powder, or aspirin products.  OK to take Tylenol.   ____ Stop supplements until after surgery.    ____ Bring C-Pap to the hospital.

## 2016-04-24 ENCOUNTER — Encounter: Payer: Self-pay | Admitting: Anesthesiology

## 2016-04-24 ENCOUNTER — Encounter: Admission: RE | Payer: Self-pay | Source: Ambulatory Visit

## 2016-04-24 ENCOUNTER — Ambulatory Visit: Admission: RE | Admit: 2016-04-24 | Payer: 59 | Source: Ambulatory Visit | Admitting: Obstetrics and Gynecology

## 2016-04-24 LAB — RPR: RPR: NONREACTIVE

## 2016-04-24 SURGERY — DILATION AND EVACUATION, UTERUS
Anesthesia: Choice

## 2016-04-24 MED ORDER — LACTATED RINGERS IV SOLN: INTRAVENOUS | Status: DC

## 2016-04-24 MED ORDER — FAMOTIDINE 20 MG PO TABS: 20.0000 mg | ORAL_TABLET | Freq: Once | ORAL | Status: DC

## 2016-04-24 NOTE — H&P (Signed)
SURGERY CANCELLATION:  Pt is feeling better following tissue passed per vagina. Desires to hold off on Suction D&C. Cramping and bleeding decreased. Pt desires to proceed with expectant observation through serial HCG monitoring as well as symptom monitoring.  ASSESSMENT: 1. SAB, complete  PLAN:  1.Weekly Quant HCG til < 5 miu 2. F/U by phone 3. IncompleteAB and Ectopic precautions.  Herold HarmsMartin A Nahia Nissan, MD

## 2016-04-27 ENCOUNTER — Other Ambulatory Visit: Payer: Self-pay

## 2016-04-27 ENCOUNTER — Other Ambulatory Visit: Payer: 59

## 2016-04-27 DIAGNOSIS — O034 Incomplete spontaneous abortion without complication: Secondary | ICD-10-CM

## 2016-04-28 ENCOUNTER — Encounter: Payer: Self-pay | Admitting: Obstetrics and Gynecology

## 2016-04-28 LAB — PATHOLOGY

## 2016-04-28 LAB — BETA HCG QUANT (REF LAB): HCG QUANT: 12 m[IU]/mL

## 2016-04-29 ENCOUNTER — Other Ambulatory Visit: Payer: Self-pay

## 2016-04-29 DIAGNOSIS — O034 Incomplete spontaneous abortion without complication: Secondary | ICD-10-CM

## 2016-04-30 ENCOUNTER — Other Ambulatory Visit: Payer: 59

## 2016-05-01 ENCOUNTER — Encounter: Payer: 59 | Admitting: Obstetrics and Gynecology

## 2016-05-05 ENCOUNTER — Other Ambulatory Visit: Payer: 59

## 2016-05-05 DIAGNOSIS — O034 Incomplete spontaneous abortion without complication: Secondary | ICD-10-CM

## 2016-05-06 LAB — BETA HCG QUANT (REF LAB): hCG Quant: <1 m[IU]/mL

## 2016-08-04 ENCOUNTER — Encounter: Payer: 59 | Admitting: Obstetrics and Gynecology

## 2017-02-18 LAB — OB RESULTS CONSOLE VARICELLA ZOSTER ANTIBODY, IGG: Varicella: IMMUNE

## 2017-02-18 LAB — OB RESULTS CONSOLE HEPATITIS B SURFACE ANTIGEN: Hepatitis B Surface Ag: NEGATIVE

## 2017-02-18 LAB — OB RESULTS CONSOLE RUBELLA ANTIBODY, IGM: Rubella: IMMUNE

## 2017-02-23 ENCOUNTER — Other Ambulatory Visit: Payer: Self-pay | Admitting: Obstetrics and Gynecology

## 2017-03-09 NOTE — L&D Delivery Note (Signed)
Delivery Note  First Stage: Labor onset: 0815 Augmentation : Cytotec x 1, Pitocin Analgesia /Anesthesia intrapartum: Epidural SROM at 0030  Second Stage: Complete dilation at 1258 FHR second stage Cat II, 135bpm, variable decels with mod variability  Delivery of a viable female on 09/03/2017 at 1320 by Heloise Ochoaebecca McVey CNM delivery of fetal head in ROA position with restitution to ROT. Noted tight nuchal cord x3, Anterior then posterior shoulders delivered easily with gentle downward traction, then delivered via somersault maneuver. Baby placed on mom's chest, and attended to by peds.  Cord double clamped after cessation of pulsation, cut by FOB  Third Stage: Placenta delivered spontaneously intact with 3VC @ 1324 Placenta disposition: routine disposal  Uterine tone Firm / bleeding: initially brisk then resolved quickly with fundal massage and cytotec 800mcg PR  1st deg perineal laceration identified  Anesthesia for repair: epidural Repair: 3-0 Vicryl SH Est. Blood Loss (mL): 200  Complications: none  Mom to postpartum.  Baby to Couplet care / Skin to Skin.  Newborn: Birth Weight: 3150g  Apgar Scores: 9/9 Feeding planned: Breast

## 2017-08-19 LAB — OB RESULTS CONSOLE GC/CHLAMYDIA
CHLAMYDIA, DNA PROBE: NEGATIVE
GC PROBE AMP, GENITAL: NEGATIVE

## 2017-08-19 LAB — OB RESULTS CONSOLE GBS: STREP GROUP B AG: NEGATIVE

## 2017-08-26 LAB — OB RESULTS CONSOLE RPR: RPR: NONREACTIVE

## 2017-08-26 LAB — OB RESULTS CONSOLE HIV ANTIBODY (ROUTINE TESTING): HIV: NONREACTIVE

## 2017-09-03 ENCOUNTER — Inpatient Hospital Stay: Payer: 59 | Admitting: Certified Registered Nurse Anesthetist

## 2017-09-03 ENCOUNTER — Inpatient Hospital Stay
Admission: EM | Admit: 2017-09-03 | Discharge: 2017-09-04 | DRG: 806 | Disposition: A | Discharge status: Home / Self Care | Payer: 59 | Attending: Certified Nurse Midwife | Admitting: Certified Nurse Midwife

## 2017-09-03 ENCOUNTER — Inpatient Hospital Stay: Payer: 59 | Admitting: Anesthesiology

## 2017-09-03 ENCOUNTER — Other Ambulatory Visit: Payer: Self-pay

## 2017-09-03 DIAGNOSIS — Z3A38 38 weeks gestation of pregnancy: Secondary | ICD-10-CM | POA: Diagnosis not present

## 2017-09-03 DIAGNOSIS — O9081 Anemia of the puerperium: Secondary | ICD-10-CM | POA: Diagnosis not present

## 2017-09-03 DIAGNOSIS — D62 Acute posthemorrhagic anemia: Secondary | ICD-10-CM | POA: Diagnosis not present

## 2017-09-03 DIAGNOSIS — O4292 Full-term premature rupture of membranes, unspecified as to length of time between rupture and onset of labor: Secondary | ICD-10-CM | POA: Diagnosis present

## 2017-09-03 LAB — CBC
HCT: 33.4 % — ABNORMAL LOW (ref 35.0–47.0)
HEMOGLOBIN: 11.4 g/dL — AB (ref 12.0–16.0)
MCH: 30.6 pg (ref 26.0–34.0)
MCHC: 34.2 g/dL (ref 32.0–36.0)
MCV: 89.7 fL (ref 80.0–100.0)
Platelets: 202 10*3/uL (ref 150–440)
RBC: 3.72 MIL/uL — AB (ref 3.80–5.20)
RDW: 14.3 % (ref 11.5–14.5)
WBC: 15 10*3/uL — AB (ref 3.6–11.0)

## 2017-09-03 LAB — TYPE AND SCREEN
ABO/RH(D): A POS
ANTIBODY SCREEN: NEGATIVE

## 2017-09-03 MED ORDER — TERBUTALINE SULFATE 1 MG/ML IJ SOLN: 0.2500 mg | Freq: Once | INTRAMUSCULAR | Status: DC | PRN

## 2017-09-03 MED ORDER — OXYCODONE HCL 5 MG PO TABS
5.0000 mg | ORAL_TABLET | ORAL | Status: DC | PRN
  Administered 2017-09-04 (×2): 5 mg via ORAL
  Filled 2017-09-03 (×2): qty 1

## 2017-09-03 MED ORDER — SIMETHICONE 80 MG PO CHEW: 80.0000 mg | CHEWABLE_TABLET | ORAL | Status: DC | PRN

## 2017-09-03 MED ORDER — OXYTOCIN 40 UNITS IN LACTATED RINGERS INFUSION - SIMPLE MED
1.0000 m[IU]/min | INTRAVENOUS | Status: DC
  Administered 2017-09-03: 2 m[IU]/min via INTRAVENOUS

## 2017-09-03 MED ORDER — EPHEDRINE 5 MG/ML INJ
10.0000 mg | INTRAVENOUS | Status: DC | PRN
  Filled 2017-09-03: qty 2

## 2017-09-03 MED ORDER — IBUPROFEN 600 MG PO TABS
ORAL_TABLET | ORAL | Status: AC
  Administered 2017-09-03: 600 mg
  Filled 2017-09-03: qty 1

## 2017-09-03 MED ORDER — BUPIVACAINE HCL (PF) 0.25 % IJ SOLN
INTRAMUSCULAR | Status: DC | PRN
  Administered 2017-09-03: 3 mL via EPIDURAL

## 2017-09-03 MED ORDER — PHENYLEPHRINE 40 MCG/ML (10ML) SYRINGE FOR IV PUSH (FOR BLOOD PRESSURE SUPPORT)
80.0000 ug | PREFILLED_SYRINGE | INTRAVENOUS | Status: DC | PRN
  Filled 2017-09-03: qty 5

## 2017-09-03 MED ORDER — FENTANYL 2.5 MCG/ML W/ROPIVACAINE 0.15% IN NS 100 ML EPIDURAL (ARMC)
12.0000 mL/h | EPIDURAL | Status: DC
  Administered 2017-09-03: 12 mL/h via EPIDURAL

## 2017-09-03 MED ORDER — WITCH HAZEL-GLYCERIN EX PADS: 1.0000 "application " | MEDICATED_PAD | CUTANEOUS | Status: DC | PRN

## 2017-09-03 MED ORDER — DIBUCAINE 1 % RE OINT: 1.0000 "application " | TOPICAL_OINTMENT | RECTAL | Status: DC | PRN

## 2017-09-03 MED ORDER — ZOLPIDEM TARTRATE 5 MG PO TABS: 5.0000 mg | ORAL_TABLET | Freq: Every evening | ORAL | Status: DC | PRN

## 2017-09-03 MED ORDER — LIDOCAINE HCL (PF) 1 % IJ SOLN
30.0000 mL | INTRAMUSCULAR | Status: AC | PRN
  Administered 2017-09-03 (×4): 3 mL via SUBCUTANEOUS

## 2017-09-03 MED ORDER — ONDANSETRON HCL 4 MG/2ML IJ SOLN
4.0000 mg | INTRAMUSCULAR | Status: DC | PRN
  Administered 2017-09-03: 4 mg via INTRAVENOUS
  Filled 2017-09-03: qty 2

## 2017-09-03 MED ORDER — MISOPROSTOL 200 MCG PO TABS
ORAL_TABLET | ORAL | Status: AC
  Administered 2017-09-03: 800 ug
  Filled 2017-09-03: qty 4

## 2017-09-03 MED ORDER — ONDANSETRON HCL 4 MG/2ML IJ SOLN
4.0000 mg | Freq: Four times a day (QID) | INTRAMUSCULAR | Status: DC | PRN
  Administered 2017-09-03: 4 mg via INTRAVENOUS
  Filled 2017-09-03: qty 2

## 2017-09-03 MED ORDER — FENTANYL 2.5 MCG/ML W/ROPIVACAINE 0.15% IN NS 100 ML EPIDURAL (ARMC)
EPIDURAL | Status: AC
  Filled 2017-09-03: qty 100

## 2017-09-03 MED ORDER — ACETAMINOPHEN 325 MG PO TABS
650.0000 mg | ORAL_TABLET | ORAL | Status: DC | PRN
  Administered 2017-09-03 – 2017-09-04 (×3): 650 mg via ORAL
  Filled 2017-09-03 (×2): qty 2

## 2017-09-03 MED ORDER — PRENATAL MULTIVITAMIN CH
1.0000 | ORAL_TABLET | Freq: Every day | ORAL | Status: DC
  Administered 2017-09-04: 1 via ORAL
  Filled 2017-09-03: qty 1

## 2017-09-03 MED ORDER — ACETAMINOPHEN 325 MG PO TABS
650.0000 mg | ORAL_TABLET | ORAL | Status: DC | PRN
  Filled 2017-09-03: qty 2

## 2017-09-03 MED ORDER — LIDOCAINE HCL (PF) 1 % IJ SOLN
INTRAMUSCULAR | Status: AC
  Filled 2017-09-03: qty 30

## 2017-09-03 MED ORDER — IBUPROFEN 600 MG PO TABS
600.0000 mg | ORAL_TABLET | Freq: Four times a day (QID) | ORAL | Status: DC
  Administered 2017-09-03 – 2017-09-04 (×4): 600 mg via ORAL
  Filled 2017-09-03 (×4): qty 1

## 2017-09-03 MED ORDER — BUTORPHANOL TARTRATE 1 MG/ML IJ SOLN: 1.0000 mg | INTRAMUSCULAR | Status: DC | PRN

## 2017-09-03 MED ORDER — LACTATED RINGERS IV SOLN: 500.0000 mL | INTRAVENOUS | Status: DC | PRN

## 2017-09-03 MED ORDER — OXYTOCIN 40 UNITS IN LACTATED RINGERS INFUSION - SIMPLE MED
INTRAVENOUS | Status: AC
  Filled 2017-09-03: qty 1000

## 2017-09-03 MED ORDER — DIPHENHYDRAMINE HCL 50 MG/ML IJ SOLN: 12.5000 mg | INTRAMUSCULAR | Status: DC | PRN

## 2017-09-03 MED ORDER — MISOPROSTOL 25 MCG QUARTER TABLET
25.0000 ug | ORAL_TABLET | Freq: Once | ORAL | Status: AC
  Administered 2017-09-03: 25 ug via BUCCAL
  Filled 2017-09-03: qty 1

## 2017-09-03 MED ORDER — LACTATED RINGERS IV SOLN
INTRAVENOUS | Status: DC
  Administered 2017-09-03 (×2): via INTRAVENOUS

## 2017-09-03 MED ORDER — SENNOSIDES-DOCUSATE SODIUM 8.6-50 MG PO TABS
2.0000 | ORAL_TABLET | ORAL | Status: DC
  Administered 2017-09-04: 2 via ORAL
  Filled 2017-09-03: qty 2

## 2017-09-03 MED ORDER — IBUPROFEN 600 MG PO TABS: 600.0000 mg | ORAL_TABLET | Freq: Four times a day (QID) | ORAL | Status: DC

## 2017-09-03 MED ORDER — PHENYLEPHRINE 40 MCG/ML (10ML) SYRINGE FOR IV PUSH (FOR BLOOD PRESSURE SUPPORT)
80.0000 ug | PREFILLED_SYRINGE | INTRAVENOUS | Status: DC | PRN
Start: 2017-09-03 — End: 2017-09-03
  Filled 2017-09-03: qty 5

## 2017-09-03 MED ORDER — SOD CITRATE-CITRIC ACID 500-334 MG/5ML PO SOLN: 30.0000 mL | ORAL | Status: DC | PRN

## 2017-09-03 MED ORDER — AMMONIA AROMATIC IN INHA
RESPIRATORY_TRACT | Status: AC
  Filled 2017-09-03: qty 10

## 2017-09-03 MED ORDER — OXYTOCIN BOLUS FROM INFUSION
500.0000 mL | Freq: Once | INTRAVENOUS | Status: AC
  Administered 2017-09-03: 500 mL via INTRAVENOUS

## 2017-09-03 MED ORDER — DIPHENHYDRAMINE HCL 25 MG PO CAPS: 25.0000 mg | ORAL_CAPSULE | Freq: Four times a day (QID) | ORAL | Status: DC | PRN

## 2017-09-03 MED ORDER — ONDANSETRON HCL 4 MG PO TABS: 4.0000 mg | ORAL_TABLET | ORAL | Status: DC | PRN

## 2017-09-03 MED ORDER — COCONUT OIL OIL
1.0000 "application " | TOPICAL_OIL | Status: DC | PRN
  Filled 2017-09-03: qty 120

## 2017-09-03 MED ORDER — LACTATED RINGERS IV SOLN
500.0000 mL | Freq: Once | INTRAVENOUS | Status: AC
  Administered 2017-09-03: 500 mL via INTRAVENOUS

## 2017-09-03 MED ORDER — OXYTOCIN 40 UNITS IN LACTATED RINGERS INFUSION - SIMPLE MED
2.5000 [IU]/h | INTRAVENOUS | Status: DC
  Administered 2017-09-03: 2.5 [IU]/h via INTRAVENOUS

## 2017-09-03 MED ORDER — LIDOCAINE-EPINEPHRINE (PF) 2 %-1:200000 IJ SOLN
INTRAMUSCULAR | Status: DC | PRN
  Administered 2017-09-03: 3 mL via EPIDURAL

## 2017-09-03 MED ORDER — OXYTOCIN 10 UNIT/ML IJ SOLN
INTRAMUSCULAR | Status: AC
  Filled 2017-09-03: qty 2

## 2017-09-03 MED ORDER — BENZOCAINE-MENTHOL 20-0.5 % EX AERO
1.0000 | INHALATION_SPRAY | CUTANEOUS | Status: DC | PRN
Start: 2017-09-03 — End: 2017-09-04
  Filled 2017-09-03: qty 56

## 2017-09-03 NOTE — OB Triage Note (Signed)
Patient arrived in triage with complaints of leaking fluid since approx 0030. Reports good fetal movement. Denies vaginal bleeding. Reports contractions about every 5 mins. EFM applied and assessing.

## 2017-09-03 NOTE — Progress Notes (Signed)
Labor Progress Note  Sheila Baker is a 37 y.o. [id number] at 4447w0d by LMP admitted for PROM this am at 740030.  Subjective: Feeling UCs in back and lower abdomen, hungry this am and would like a snack. Continues to leak clear fluid.   Objective: BP 123/70 (BP Location: Left Arm)   Pulse 73   Temp 98.7 F (37.1 C) (Oral)   Resp 16   Ht 5\' 5"  (1.651 m)   Wt 197 lb (89.4 kg)   LMP 12/11/2016   BMI 32.78 kg/m  Notable VS details: reviewed  Fetal Assessment: FHT:  FHR: 135 bpm, variability: moderate,  accelerations:  Present,  decelerations:  Absent Category/reactivity:  Category I UC:   irregular, every 6-8 minutes; s/p 1 dose cytotec at 0410 SVE:  Dilation: 4.5 Effacement (%): 70, 80 Station: -1 Presentation: Vertex Exam by:: McVey CNM  Membrane status: SROM at 0030 09/03/17 Amniotic color: clear  Labs: Lab Results  Component Value Date   WBC 15.0 (H) 09/03/2017   HGB 11.4 (L) 09/03/2017   HCT 33.4 (L) 09/03/2017   MCV 89.7 09/03/2017   PLT 202 09/03/2017    Assessment / Plan: Early labor with PROM x 8hrs.   Labor: augmentation with cytotec x 1, will start Pitocin now.  Fetal Wellbeing:  Category I Pain Control:  Labor support without medications; plan epidural when painful UCs.  I/D:  n/a Anticipated MOD:  NSVD  McVey, REBECCA A, CNM 09/03/2017, 8:28 AM

## 2017-09-03 NOTE — Anesthesia Procedure Notes (Addendum)
Epidural Patient location during procedure: OB Start time: 09/03/2017 9:47 AM End time: 09/03/2017 10:40 AM  Staffing Anesthesiologist: Tamaira Ciriello, Cleda MccreedyJoseph K, MD Resident/CRNA: Malva CoganBeane, Catherine, CRNA Other anesthesia staff: Ginger CarneMichelet, Stephanie, CRNA Performed: anesthesiologist, resident/CRNA and other anesthesia staff   Preanesthetic Checklist Completed: patient identified, site marked, surgical consent, pre-op evaluation, timeout performed, IV checked, risks and benefits discussed and monitors and equipment checked  Epidural Patient position: sitting Prep: ChloraPrep Patient monitoring: heart rate, continuous pulse ox and blood pressure Approach: midline Location: L3-L4 Injection technique: LOR saline  Needle:  Needle type: Tuohy  Needle gauge: 18 G Needle length: 9 cm and 9 Needle insertion depth: 7 and 7.5 cm Catheter type: closed end flexible Catheter size: 19 Gauge Catheter at skin depth: 12 cm Test dose: negative and 1.5% lidocaine with Epi 1:200 K  Assessment Sensory level: T10 Events: blood not aspirated, injection not painful, no injection resistance, negative IV test and no paresthesia  Additional Notes 2 attempts C. Beane, 1 attempt S. Michelet, 2 attempts J. Felesia Stahlecker.  Patient had multiple left sided transient paresthesias that resolved with needle retraction and re direction  Pt. Evaluated and documentation done after procedure finished. Patient identified. Risks/Benefits/Options discussed with patient including but not limited to bleeding, infection, nerve damage, paralysis, failed block, incomplete pain control, headache, blood pressure changes, nausea, vomiting, reactions to medication both or allergic, itching and postpartum back pain. Confirmed with bedside nurse the patient's most recent platelet count. Confirmed with patient that they are not currently taking any anticoagulation, have any bleeding history or any family history of bleeding disorders. Patient  expressed understanding and wished to proceed. All questions were answered. Sterile technique was used throughout the entire procedure. Please see nursing notes for vital signs. Test dose was given through epidural catheter and negative prior to continuing to dose epidural or start infusion. Warning signs of high block given to the patient including shortness of breath, tingling/numbness in hands, complete motor block, or any concerning symptoms with instructions to call for help. Patient was given instructions on fall risk and not to get out of bed. All questions and concerns addressed with instructions to call with any issues or inadequate analgesia.   Patient tolerated the insertion well without immediate complications.Reason for block:procedure for pain

## 2017-09-03 NOTE — Progress Notes (Signed)
Wireless monitors applied. Pt ambulating in hall.

## 2017-09-03 NOTE — H&P (Signed)
OB History & Physical   History of Present Illness:  Chief Complaint:   HPI:  Sheila Baker is a 37 y.o. G88P2002 female at [redacted]w[redacted]d dated by LMP c/w 9 week ultrasound.  She presents to L&D reporting SROM and contractions.   She reports:  -active fetal movement -LOF/SROM at 0030 of clear fluid that has continued to leak -no vaginal bleeding -onset of contractions at 0030 currently every 5-7 minutes  Pregnancy Issues: 1. Prior preterm delivery at 36w d/t PPROM, 17-OHP started at [redacted]w[redacted]d 2. Age 33 at delivery 3. History of postpartum depression   Maternal Medical History:   Past Medical History:  Diagnosis Date  . Acid reflux   . Environmental allergies     Past Surgical History:  Procedure Laterality Date  . SEPTOPLASTY    . WRIST SURGERY      Allergies  Allergen Reactions  . Shellfish Allergy Swelling    Swelling of mouth  . Fentanyl Nausea And Vomiting  . Gluten Meal Diarrhea  . Latex Other (See Comments)    Skin irritation  . Sulfa Antibiotics Other (See Comments)    Fatigue    Prior to Admission medications   Medication Sig Start Date End Date Taking? Authorizing Provider  acetaminophen (TYLENOL) 500 MG tablet Take 500 mg by mouth every 6 (six) hours as needed.   Yes [provider]  Prenat-FeFum-FePo-FA-Omega 3 (TARON-C DHA) 53.5-38-1 MG CAPS TAKE 1 CAPSULE DAILY 02/23/17  Yes Defrancesco, Prentice Docker, MD  Lactobacillus (PROBIOTIC ACIDOPHILUS PO) Take 1 tablet by mouth daily.     [provider]  Polyethyl Glycol-Propyl Glycol (SYSTANE OP) Place 1 drop into both eyes 2 (two) times daily as needed (dry eyes).    [provider]     Prenatal care site: [facility]    Social History: She  reports that she has never smoked. She has never used smokeless tobacco. She reports that she drinks alcohol. She reports that she does not use drugs.  Family History: family history includes Breast cancer in her paternal grandmother; Diabetes in  her maternal grandmother; Heart disease in her maternal grandfather and maternal grandmother.   Review of Systems: A full review of systems was performed and negative except as noted in the HPI.    Physical Exam:  Vital Signs: BP 127/78 (BP Location: Left Arm)   Pulse 72   Temp 98.3 F (36.8 C) (Oral)   Resp 18   Ht 5\' 5"  (1.651 m)   Wt 89.4 kg (197 lb)   BMI 32.78 kg/m   General:   alert, cooperative, appears stated age and no distress  Skin:  normal and no rash or abnormalities  Neurologic:    Alert & oriented x 3  Lungs:   clear to auscultation bilaterally  Heart:   regular rate and rhythm, S1, S2 normal, no murmur, click, rub or gallop  Abdomen:  soft, non-tender; bowel sounds normal; no masses,  no organomegaly  Pelvis:  Exam deferred.  FHT:  130 BPM  Presentations: cephalic  Cervix:  exam at 0340 by RN Graciela Husbands   Dilation: 3cm   Effacement: 60%   Station:  -2  Extremities: : non-tender, symmetric, no edema bilaterally.     EFW: 6lbs 12oz  No results found for this or any previous visit (from the past 24 hour(s)).  Pertinent Results:  Prenatal Labs: Blood type/Rh A+  Antibody screen neg  Rubella Immune  Varicella Immune  RPR NR  HBsAg Neg  HIV  NR  GC neg  Chlamydia neg  Genetic screening declined  1 hour GTT 96  3 hour GTT n/a  GBS negative   FHT: FHR: 130 bpm, variability: moderate,  accelerations:  Present,  decelerations:  Absent Category/reactivity:  Category I TOCO: irregular, every 5-10 minutes  Assessment:  Sheila Baker is a 37 y.o. 793P2002 female at 4044w0d with SROM.   Plan:  1. Admit to Labor & Delivery; consents reviewed and obtained  2. Fetal Well being  - Fetal Tracing: category I - GBS negative - Presentation: cephalic confirmed by Leopold's   3. Routine OB: - Prenatal labs reviewed, as above - Rh positve - CBC & T&S on admit - Clear fluids, IVF  4. Augmentation of Labor: -  Contractions monitored with external toco in  place -  Pelvis proven to 8lb 9oz -  Plan for augmentation with misoprostol -  Plan for continuous fetal monitoring  -  Maternal pain control as desired: IVPM, nitrous, regional anesthesia -  Anticipate vaginal delivery  5. Post Partum Planning: - Infant feeding: breastfeeding - Contraception: unsure  Sheila Baker, CNM 09/03/2017 4:10 AM ----- Sheila Baker Certified Nurse Midwife [facility], Department of OB/GYN [facility]

## 2017-09-03 NOTE — Discharge Summary (Signed)
Obstetric Discharge Summary   Patient ID: Patient Name: Sheila Baker DOB: 12/14/1980 MRN: [id number]  Date of Admission: 09/03/2017 Date of Delivery: 09/03/17 Delivered by: Heloise Ochoaebecca McVey CNM Date of Discharge: 09/04/2017  [facility]  BJY:[id number]:Patient's last menstrual period was 12/11/2016. EDC Estimated Date of Delivery: 09/17/17 Gestational Age at Delivery: 7029w0d   Antepartum complications:  1. Prior preterm delivery at 36w d/t PPROM, 17-OHP started at 7118w5d 2. Age 37 at delivery 3. History of postpartum depression  Admitting Diagnosis: PROM at 38+0wks  Secondary Diagnoses: Patient Active Problem List   Diagnosis Date Noted  . Labor and delivery, indication for care 09/03/2017  . Pelvic cramping 04/16/2016  . First trimester bleeding 04/16/2016  . Vulvar itching 04/16/2016  . Vaginal bleeding 04/16/2016  . H/O female dyspareunia 02/06/2016  . Family history of endometriosis 02/06/2016  . History of dysmenorrhea 02/06/2016  . Overweight (BMI 25.0-29.9) 02/06/2016  . 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 Pt received an epidural, progressed to C/C/+1 and pushed effectively with 3 UCs to crowning, delivered via SVD with tight [place] x 3. Infant with spontaneous cry and placed on maternal abdomen.  Delivery Type: spontaneous vaginal delivery Anesthesia: epidural Placenta: spontaneous Laceration: 1st deg perineal Episiotomy: none  Newborn Data: Live born female "Abigail" Birth Weight: 3150g APGAR: 9, 9  Newborn Delivery   Birth date/time:  09/03/2017 13:20:00 Delivery type:  Vaginal, Spontaneous     Postpartum Course  Patient had an uncomplicated postpartum course.  By time of discharge on PPD#1,  her pain was controlled on oral pain medications; she had appropriate lochia and was ambulating, voiding without  difficulty and tolerating regular diet.  She was deemed stable for discharge to home.       Labs: CBC Latest Ref Rng & Units 09/04/2017 09/03/2017 04/23/2016  WBC 3.6 - 11.0 K/uL 13.2(H) 15.0(H) 11.3(H)  Hemoglobin 12.0 - 16.0 g/dL 10.4(L) 11.4(L) 13.1  Hematocrit 35.0 - 47.0 % 29.9(L) 33.4(L) 39.1  Platelets 150 - 440 K/uL 163 202 280   A POS  Physical exam:  BP 108/60 (BP Location: Left Arm)   Pulse 64   Temp 98.6 F (37 C) (Oral)   Resp 18   Ht 5\' 5"  (1.651 m)   Wt 197 lb (89.4 kg)   LMP 12/11/2016   SpO2 99%   Breastfeeding? Unknown   BMI 32.78 kg/m  General: alert and no distress Pulm: normal respiratory effort Lochia: appropriate Abdomen: soft, NT Uterine Fundus: firm, below umbilicus Perineum: well approximated/intact Extremities: No evidence of DVT seen on physical exam. No lower extremity edema.   Disposition: stable, discharge to home Baby Feeding: breastmilk Baby Disposition: home with mom  Contraception: TBD  Prenatal Labs:  Blood type/Rh A+  Antibody screen neg  Rubella Immune  Varicella Immune  RPR NR  HBsAg Neg  HIV NR  GC neg  Chlamydia neg  Genetic screening declined  1 hour GTT 96  3 hour GTT n/a  GBS negative    Rh Immune globulin given: n/a Tdap: given 07/06/17 Flu: given 03/17/17  Plan:  Sheila Baker was discharged to home in good condition. [facility] with delivery provider in 4-6 weeks   Discharge Instructions: Per After Visit Summary. Activity: Advance as tolerated. Pelvic rest for 6 weeks.   Diet: Regular Discharge Medications: Allergies as of 09/04/2017      Reactions  Shellfish Allergy Swelling   Swelling of mouth   Fentanyl Nausea And Vomiting   Gluten Meal Diarrhea   Latex Other (See Comments)   Skin irritation   Sulfa Antibiotics Other (See Comments)   Fatigue      Medication List    TAKE these medications   acetaminophen 325 MG tablet Commonly known as:  TYLENOL Take 2  tablets (650 mg total) by mouth every 4 (four) hours as needed (for pain scale < 4). What changed:    medication strength  how much to take  when to take this  reasons to take this   ferrous sulfate 325 (65 FE) MG tablet Take 1 tablet (325 mg total) by mouth 2 (two) times daily with a meal. For anemia, take with Vitamin C   ibuprofen 600 MG tablet Commonly known as:  ADVIL,MOTRIN Take 1 tablet (600 mg total) by mouth every 6 (six) hours.   oxyCODONE 5 MG immediate release tablet Commonly known as:  Oxy IR/ROXICODONE Take 1 tablet (5 mg total) by mouth every 4 (four) hours as needed (pain scale 4-7).   PROBIOTIC ACIDOPHILUS PO Take 1 tablet by mouth daily.   senna-docusate 8.6-50 MG tablet Commonly known as:  Senokot-S Take 2 tablets by mouth daily.   SYSTANE OP Place 1 drop into both eyes 2 (two) times daily as needed (dry eyes).   TARON-C DHA 53.5-38-1 MG Caps TAKE 1 CAPSULE DAILY      Outpatient follow up:  Follow-up Information    McVey, Prudencio Pair, CNM Follow up in 6 week(s).   Specialty:  Obstetrics and Gynecology Contact information: [address] [place] [address] [phone]            Signed:  Heloise Ochoa A, CNM  09/04/2017  10:00 AM

## 2017-09-03 NOTE — Anesthesia Preprocedure Evaluation (Addendum)
Anesthesia Evaluation  Patient identified by MRN, date of birth, ID band Patient awake    Reviewed: Allergy & Precautions, NPO status , Patient's Chart, lab work & pertinent test results  History of Anesthesia Complications (+) history of anesthetic complications (one sided epidural)  Airway Mallampati: II  TM Distance: >3 FB     Dental   Pulmonary           Cardiovascular      Neuro/Psych    GI/Hepatic GERD  ,  Endo/Other    Renal/GU      Musculoskeletal   Abdominal   Peds  Hematology   Anesthesia Other Findings Pt reports left side back pain x 5 years after previous epidural.  Also reports extremity numbness prior to pregnancy, unable to have MRI scheduled due to pregnancy.  ? MS.  Discussed nausea/vomiting that occurred with fentanyl administration in the past.  Pt received IV fentanyl that caused the nausea/vomiting, but had no respiratory or anaphylactic symptoms.  Reproductive/Obstetrics (+) Pregnancy                           Anesthesia Physical Anesthesia Plan  ASA: II  Anesthesia Plan: Epidural   Post-op Pain Management:    Induction:   PONV Risk Score and Plan:   Airway Management Planned:   Additional Equipment:   Intra-op Plan:   Post-operative Plan:   Informed Consent: I have reviewed the patients History and Physical, chart, labs and discussed the procedure including the risks, benefits and alternatives for the proposed anesthesia with the patient or authorized representative who has indicated his/her understanding and acceptance.     Plan Discussed with: CRNA and Anesthesiologist  Anesthesia Plan Comments: (Patient consented that if she did indeed have MS, that epidural placement could cause a flare.  Patient voiced understanding.)       Anesthesia Quick Evaluation

## 2017-09-04 LAB — CBC
HCT: 29.9 % — ABNORMAL LOW (ref 35.0–47.0)
Hemoglobin: 10.4 g/dL — ABNORMAL LOW (ref 12.0–16.0)
MCH: 31.3 pg (ref 26.0–34.0)
MCHC: 34.8 g/dL (ref 32.0–36.0)
MCV: 90 fL (ref 80.0–100.0)
Platelets: 163 10*3/uL (ref 150–440)
RBC: 3.32 MIL/uL — ABNORMAL LOW (ref 3.80–5.20)
RDW: 14.4 % (ref 11.5–14.5)
WBC: 13.2 10*3/uL — ABNORMAL HIGH (ref 3.6–11.0)

## 2017-09-04 LAB — RPR: RPR: NONREACTIVE

## 2017-09-04 MED ORDER — SENNOSIDES-DOCUSATE SODIUM 8.6-50 MG PO TABS: 2.0000 | ORAL_TABLET | ORAL | Status: DC

## 2017-09-04 MED ORDER — IBUPROFEN 600 MG PO TABS: 600.0000 mg | ORAL_TABLET | Freq: Four times a day (QID) | ORAL | 0 refills | Status: DC

## 2017-09-04 MED ORDER — ACETAMINOPHEN 325 MG PO TABS: 650.0000 mg | ORAL_TABLET | ORAL | Status: DC | PRN

## 2017-09-04 MED ORDER — FERROUS SULFATE 325 (65 FE) MG PO TABS: 325.0000 mg | ORAL_TABLET | Freq: Two times a day (BID) | ORAL | 3 refills | Status: DC

## 2017-09-04 MED ORDER — OXYCODONE HCL 5 MG PO TABS: 5.0000 mg | ORAL_TABLET | ORAL | 0 refills | Status: DC | PRN

## 2017-09-04 NOTE — Progress Notes (Signed)
Post Partum Day 1 Subjective: Doing well, no complaints.  Tolerating regular diet, pain with PO meds, voiding and ambulating without difficulty.  No CP SOB Fever,Chills, N/V or leg pain; denies nipple or breast pain; no HA change of vision, RUQ/epigastric pain  Objective: BP 108/60 (BP Location: Left Arm)   Pulse 64   Temp 98.6 F (37 C) (Oral)   Resp 18   Ht 5\' 5"  (1.651 m)   Wt 197 lb (89.4 kg)   LMP 12/11/2016   SpO2 99%   Breastfeeding? Unknown   BMI 32.78 kg/m    Physical Exam:  General: NAD Breasts: soft/nontender CV: RRR Pulm: nl effort, CTABL Abdomen: soft, NT, BS x 4 Perineum: minimal edema, repair well approximated Lochia: small Uterine Fundus: fundus firm and 2 fb below umbilicus DVT Evaluation: no cords, ttp LEs   Recent Labs    09/03/17 0441 09/04/17 0700  HGB 11.4* 10.4*  HCT 33.4* 29.9*  WBC 15.0* 13.2*  PLT 202 163    Assessment/Plan: 37 y.o. G3P3003 postpartum day # 1  - Continue routine PP care- pt desires DC home today if NB is to be DC after 24hrs - Lactation consult prn  - Discussed contraceptive options including implant, IUDs hormonal and non-hormonal, injection, pills/ring/patch, condoms, and NFP. Pt undecided and considering POPs vs spouse vasectomy.  - Acute blood loss anemia - hemodynamically stable and asymptomatic; continue po ferrous sulfate BID with stool softeners  - Immunization status:  all Imms up to date    Disposition: Does desire Dc home today.     Sherel Fennell A, CNM 09/04/2017  9:16 AM

## 2017-09-04 NOTE — Lactation Note (Deleted)
This note was copied from a baby's chart. Lactation Consultation Note  Patient Name: Sheila Baker ZOXWR'UToday's Date: 09/04/2017 Reason for consult: Initial assessment;Primapara;Preterm <34wks;Mother's request;NICU baby Assisted [facility]  Demonstrated warmth, massage of breast and hand expression.  Discussed pumping, collection, storage, labeling, cleaning and handling of breast milk letting mom return demonstration.  Hand expressed and pumped 9 ml which was placed in refrigerator.  Unable to do skin to skin at this time b/c when let Declan suck on pacifier, his SATs dropped. Praised mom for commitment to supply breast milk for her baby. Encouraged mom to call for lactation assistance or if have any questions or concerns.   Maternal Data Formula Feeding for Exclusion: No Has patient been taught Hand Expression?: Yes(Can easily hand express colostrum into 11 ml vial) Does the patient have breastfeeding experience prior to this delivery?: No  Feeding Feeding Type: Other (comment)(NPO at present - SATs lowered with just sucking on pacifier) Length of feed: 10 min  LATCH Score                   Interventions Interventions: Hand express;Expressed milk;DEBP;Coconut oil;Breast massage;Support pillows  Lactation Tools Discussed/Used Tools: Pump;Flanges;Coconut oil Flange Size: 27 Breast pump type: Double-Electric Breast Pump(Discussed process to get DEBP through [employer]) WIC Program: Yes(Mom also has [employer]) Pump Review: Setup, frequency, and cleaning;Milk Storage;Other (comment) Initiated by:: S.Vennessa Affinito,RN,BSN,IBCLC Date initiated:: 09/04/17   Consult Status Consult Status: Follow-up Date: 09/04/17 Follow-up type: Call as needed    Sheila Baker, Sheila Baker 09/04/2017, 1:34 PM

## 2017-09-04 NOTE — Progress Notes (Signed)
Discharge instructions given. Patient verbalizes understanding of teaching. Oxycodone prescription shredded, per R. McVey, CNM. CNM sent RX to pharmacy. Patient updated. Patient discharged home via wheelchair at 1745.

## 2017-09-04 NOTE — Anesthesia Postprocedure Evaluation (Signed)
Anesthesia Post Note  Patient: Sheila Baker  Procedure(s) Performed: AN AD HOC LINE PLACEMENT  Patient location during evaluation: Mother Baby Anesthesia Type: Epidural Level of consciousness: awake and alert Pain management: pain level controlled Vital Signs Assessment: post-procedure vital signs reviewed and stable Respiratory status: spontaneous breathing, nonlabored ventilation and respiratory function stable Cardiovascular status: stable Postop Assessment: no headache, patient able to bend at knees and able to ambulate Comments: Patient has lumbar back pain.  Patient has history of chronic lumbar back pain.     Last Vitals:  Vitals:   09/04/17 0318 09/04/17 0823  BP: 113/66 108/60  Pulse: 63 64  Resp: 18 18  Temp: 36.8 C 37 C  SpO2: 99% 99%    Last Pain:  Vitals:   09/04/17 0823  TempSrc: Oral  PainSc:                  Cleda MccreedyJoseph K Piscitello

## 2017-09-04 NOTE — Lactation Note (Signed)
This note was copied from a baby's chart. Lactation Consultation Note  Patient Name: Sheila Baker ZOXWR'UToday's Date: 09/04/2017 Reason for consult: Initial assessment;Early term 37-38.6wks;Other (Comment)(Pumped with other 2 d/t poor latch) Observed breast feeding with good rhythmic sucking with swallows.  Mom reports Sheila Baker is latching well.  With other 2 babies mom pumped because of poor latching, but really wants to just breast feed Abigail.  Reviewed supply and demand, normal course of lactation and routine newborn feeding patterns.  Lactation name and number written on white board and encouraged to call with any questions, concerns or assistance.  Maternal Data Formula Feeding for Exclusion: No Has patient been taught Hand Expression?: Yes Does the patient have breastfeeding experience prior to this delivery?: Yes  Feeding Feeding Type: Breast Fed Length of feed: 10 min  LATCH Score Latch: Grasps breast easily, tongue down, lips flanged, rhythmical sucking.  Audible Swallowing: A few with stimulation  Type of Nipple: Everted at rest and after stimulation  Comfort (Breast/Nipple): Soft / non-tender  Hold (Positioning): No assistance needed to correctly position infant at breast.  LATCH Score: 9  Interventions Interventions: Breast feeding basics reviewed;Breast compression;Support pillows;Position options  Lactation Tools Discussed/Used     Consult Status Consult Status: PRN Follow-up type: Call as needed    Louis MeckelWilliams, Horacio Werth Kay 09/04/2017, 9:49 AM

## 2017-09-17 ENCOUNTER — Inpatient Hospital Stay: Admission: RE | Admit: 2017-09-17 | Payer: 59 | Source: Ambulatory Visit | Admitting: *Deleted

## 2019-10-03 ENCOUNTER — Other Ambulatory Visit: Payer: Self-pay | Admitting: Neurology

## 2019-10-03 DIAGNOSIS — R202 Paresthesia of skin: Secondary | ICD-10-CM

## 2019-11-03 ENCOUNTER — Ambulatory Visit
Admission: RE | Admit: 2019-11-03 | Discharge: 2019-11-03 | Disposition: A | Discharge status: Home / Self Care | Payer: 59 | Source: Ambulatory Visit | Attending: Neurology | Admitting: Neurology

## 2019-11-03 ENCOUNTER — Other Ambulatory Visit: Payer: Self-pay

## 2019-11-03 DIAGNOSIS — R202 Paresthesia of skin: Secondary | ICD-10-CM | POA: Diagnosis present

## 2019-11-03 DIAGNOSIS — R200 Anesthesia of skin: Secondary | ICD-10-CM | POA: Insufficient documentation

## 2019-11-03 MED ORDER — GADOBUTROL 1 MMOL/ML IV SOLN
7.5000 mL | Freq: Once | INTRAVENOUS | Status: AC | PRN
  Administered 2019-11-03: 7.5 mL via INTRAVENOUS

## 2019-12-15 ENCOUNTER — Other Ambulatory Visit (HOSPITAL_COMMUNITY): Payer: Self-pay | Admitting: Neurology

## 2019-12-15 ENCOUNTER — Other Ambulatory Visit: Payer: Self-pay | Admitting: Neurology

## 2019-12-15 DIAGNOSIS — R200 Anesthesia of skin: Secondary | ICD-10-CM

## 2019-12-15 DIAGNOSIS — R202 Paresthesia of skin: Secondary | ICD-10-CM

## 2020-02-20 ENCOUNTER — Other Ambulatory Visit: Payer: Self-pay | Admitting: Gastroenterology

## 2020-02-20 DIAGNOSIS — R1011 Right upper quadrant pain: Secondary | ICD-10-CM

## 2020-03-04 ENCOUNTER — Encounter: Payer: Self-pay | Admitting: Radiology

## 2020-03-05 ENCOUNTER — Ambulatory Visit
Admission: RE | Admit: 2020-03-05 | Discharge: 2020-03-05 | Disposition: A | Discharge status: Home / Self Care | Payer: 59 | Source: Ambulatory Visit | Attending: Gastroenterology | Admitting: Gastroenterology

## 2020-03-05 ENCOUNTER — Ambulatory Visit
Admission: RE | Admit: 2020-03-05 | Discharge: 2020-03-05 | Disposition: A | Discharge status: Home / Self Care | Payer: 59 | Source: Ambulatory Visit

## 2020-03-05 ENCOUNTER — Other Ambulatory Visit: Payer: Self-pay

## 2020-03-05 DIAGNOSIS — R1011 Right upper quadrant pain: Secondary | ICD-10-CM

## 2020-03-05 MED ORDER — TECHNETIUM TC 99M MEBROFENIN IV KIT
5.0000 | PACK | Freq: Once | INTRAVENOUS | Status: AC | PRN
  Administered 2020-03-05: 5.17 via INTRAVENOUS

## 2020-04-04 ENCOUNTER — Other Ambulatory Visit: Payer: Self-pay | Admitting: Neurology

## 2020-04-04 ENCOUNTER — Other Ambulatory Visit (HOSPITAL_COMMUNITY): Payer: Self-pay | Admitting: Neurology

## 2020-04-04 DIAGNOSIS — M545 Low back pain, unspecified: Secondary | ICD-10-CM

## 2020-04-04 DIAGNOSIS — G8929 Other chronic pain: Secondary | ICD-10-CM

## 2020-04-04 DIAGNOSIS — R29898 Other symptoms and signs involving the musculoskeletal system: Secondary | ICD-10-CM

## 2020-04-12 ENCOUNTER — Other Ambulatory Visit: Payer: Self-pay

## 2020-04-12 ENCOUNTER — Ambulatory Visit
Admission: RE | Admit: 2020-04-12 | Discharge: 2020-04-12 | Disposition: A | Discharge status: Home / Self Care | Payer: 59 | Source: Ambulatory Visit | Attending: Neurology | Admitting: Neurology

## 2020-04-12 DIAGNOSIS — M545 Low back pain, unspecified: Secondary | ICD-10-CM | POA: Insufficient documentation

## 2020-04-12 DIAGNOSIS — G8929 Other chronic pain: Secondary | ICD-10-CM | POA: Diagnosis present

## 2020-04-12 DIAGNOSIS — R29898 Other symptoms and signs involving the musculoskeletal system: Secondary | ICD-10-CM | POA: Insufficient documentation

## 2020-05-22 ENCOUNTER — Other Ambulatory Visit: Payer: Self-pay

## 2020-05-22 ENCOUNTER — Ambulatory Visit: Payer: 59 | Attending: Neurology | Admitting: Physical Therapy

## 2020-05-22 ENCOUNTER — Encounter: Payer: Self-pay | Admitting: Physical Therapy

## 2020-05-22 DIAGNOSIS — R262 Difficulty in walking, not elsewhere classified: Secondary | ICD-10-CM

## 2020-05-22 DIAGNOSIS — M5441 Lumbago with sciatica, right side: Secondary | ICD-10-CM | POA: Diagnosis present

## 2020-05-22 DIAGNOSIS — G8929 Other chronic pain: Secondary | ICD-10-CM | POA: Diagnosis present

## 2020-05-22 NOTE — Therapy (Signed)
[facility] PHYSICAL AND SPORTS MEDICINE [address] [place], [place], [id number] Phone: [phone]   Fax:  [phone]  Physical Therapy Evaluation  Patient Details  Name: Sheila Baker MRN: [id number] Date of Birth: 05/02/1980 Referring Provider (PT): Susa Loffleremang Shah, MD (neurology)   Encounter Date: 05/22/2020   PT End of Session - 05/22/20 2041    Visit Number 1    Number of Visits 24    Date for PT Re-Evaluation 08/14/20    Authorization Type [employer] reporting period from 05/22/2020    Authorization Time Period 60 visits remain OT/PT/Chiro (15 used when insurance verified prior to IE) No Pre Cert [facility] SporeReq  Auth [id number]    Authorization - Visit Number 1    Authorization - Number of Visits 45    Progress Note Due on Visit 10    PT Start Time 1735    PT Stop Time 1815    PT Time Calculation (min) 40 min    Activity Tolerance Patient tolerated treatment well    Behavior [facility] tasks assessed/performed           Past Medical History:  Diagnosis Date  . Acid reflux   . Environmental allergies     Past Surgical History:  Procedure Laterality Date  . SEPTOPLASTY    . WRIST SURGERY      There were no vitals filed for this visit.    Subjective Assessment - 05/22/20 1744    Subjective Patient states she has had chronic pain in her right low back on and off for years. Most recent exacerbation started Jan 2022 and was getting better but her pain has been a lot worse the last 3 days. She thinks it may have been irritated by bending over a lot training her daughter in potty training. She took adult tap dancing and then one night after tap [facility] and could hardly even move. She quit tap dancing, got MRI, and went to the chiropractor for a few weeks. She is unsure if chiropractor helped.. Her mother had similar issues and McKenzie method was the only thing that helped her. She also notices  it gets worse when she gets her period. This has been going on for years on and off but never this bad. Cannot remember it going down her leg before. Has had numbness and tingling in all of her extremities, which is why she went to neurologist. She was dx with carpal tunnel and a Vit D deficiency. The nerve conduction study suggested she had problems in the back which lead to the lumbar MRI. The paresthesia has resolved or tingling as much since she has taken more Vit D. Does not notice tingling in the R foot. Pain has gone to the R calf intermittently. Has gone all the way to her foot. Pain is from R  base of spine to right leg. Has not noticed any weakness. She has a hard time moving the right ide when she gets up. Flexeril, prednisone for allergic reaction helped back temporarily. MS has been ruled out. 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 She has an almost 40 year old and an 428 and 40 year old.  States chiropractor told her she has beginning signs of arthritis in the neck and 7 discs in the spine that are "off" that they saw through the radiographs. Told her not to lift. Gave her some  stretches for the neck and low back but she has not been doing much for the low back.    Pertinent History Patient is a 40 y.o. female who presents to outpatient physical therapy with a referral for medical diagnosis chronic low back pain. This patient's chief complaints consist of exacerbation of chronic right sided low back pain with radiation down the posterior R LE to the calf leading to the following functional deficits: difficulty with usual activities including cleaning dishes, using bathroom, homeschooling and caring for children, bending, lifting, twisting, sleeping,driving/travleing, dancing, walking, etc.  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  Patient denies hx of cancer, stroke, seizures, lung  problem, major cardiac events, diabetes, unexplained weight loss, changes in bowel or bladder problems, new onset stumbling or dropping things, Back surgery.    Limitations Sitting;Reading;Lifting;Standing;Walking;House hold activities;Other (comment)   difficulty with usual activities including cleaning dishes, using bathroom, homeschooling and caring for children, bending, lifting, twisting, sleeping,driving/travleing, dancing, walking, etc.   Diagnostic tests Lumbar MRI 04/12/2020: "IMPRESSION:  1. Small right central disc protrusion at L5-S1 abutting the  traversing right S1 nerve root.  2. No spinal canal or neural foraminal stenosis at any level."    Currently in Pain? Yes    Pain Score 7    W: 9-10/10; B: 3/10   Pain Location Back    Pain Orientation Right;Lower    Pain Descriptors / Indicators Shooting;Burning    Pain Type Chronic pain    Pain Radiating Towards back of R leg to ankle at times.    Pain Onset More than a month ago    Pain Frequency Constant    Aggravating Factors  bending forward, sitting, laying on R side, going from sitting to standing, evenings, coughing, sneezing.    Pain Relieving Factors standing, walking, laying flat on back    Effect of Pain on Daily Activities Functional Limitations: difficulty with usual activities including cleaning dishes, using bathroom, homeschooling and caring for children, bending, lifting, twisting, sleeping,driving/travleing, dancing, walking, etc.              [facility] PT Assessment - 05/22/20 1749      Assessment   Medical Diagnosis chronic low back pain    Referring Provider (PT) Susa Loffler, MD (neurology)    Onset Date/Surgical Date --   chronic over years, most recent exacerbation started mid jan 2022   Hand Dominance Right    Prior Therapy chiropractor for 1-2 months      Precautions   Precautions None      Restrictions   Weight Bearing Restrictions No      Balance Screen   Has the patient fallen in the past 6 months No     Has the patient had a decrease in activity level because of a fear of falling?  No    Is the patient reluctant to leave their home because of a fear of falling?  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 Posture: no lateral shift  . Tremor: none . Muscle bulk: no abnormalities noted  . Bed mobility: supine <> Sit and rolling mod I with increased time due to pain . Transfers: supine <> sit mod I for increased time due to pain . Gait: grossly WFL for household and short community ambulation. More detailed gait analysis deferred to later date as needed.   NEUROLOGICAL Dermatomes . L2-S2 appears equal and intact to light touch. Myotomes . L2-S2 appears intact Deep Tendon Reflexes R/L  . 2+/2+ Quadriceps reflex (L4) . 0/1+ Achilles reflex (S1) Neurodynamic Tests   . R slump positive for concordant symptoms.    SPINE MOTION Lumbar AROM *Indicates pain - Flexion: = mid shins, increased concordant pain, worse after. (fingers used to go to toes).  - Extension: = 50% concordant pain in back of R leg, worse - Rotation: R = 75% worse in back/leg L = 75% worse leg and back - Side Flexion: R = WFL except increased R leg pain., L = WFL increased R leg pain little bit. - Side glide: R = WFL pain in low back, L = WFL pain in low back.  PERIPHERAL JOINT MOTION (in degrees) B LE appears [facility] for basic mobility. More detailed assessment deferred as needed.    MUSCLE PERFORMANCE (MMT):  *Indicates pain 05/22/20  Date Date  Joint/Motion R/L R/L R/L  Hip     Flexion 4+*/4* / /  Extension (knee ext) 4*/4+ / /  Knee     Extension 5/5 / /  Flexion 4+/4+ / /  Comments: R back pain with R hip flexion and extension. B ankles WFL and equal bilaterally. Able to heel and toe walk without UE support.   SUSTAINED POSITIONS TESTING:  Position Time During  After   Lying prone in ext. (Prone on elbows) 1 minute Peripheralizing No worse    REPEATED MOTIONS TESTING: Motion/Technique sets x reps During After  Lumbar extension in lying (prone) 2x10  Self OP 2nd set Unclear, not worse Slightly better, decreased pain intensity and appears to be gradually centralizing. Note: accessory assessment performed prior to repeated motions in prone.    SPECIAL TESTS: Slump: R = positive for concordant leg pain,   ACCESSORY MOTION:  - CPA to lumbar spine felt painful in back but also good, especially at lowest level  PALPATION: - TTP R glute and deep hip rotator region, R lumbar paraspinals.  EDUCATION/COGNITION: Patient is alert and oriented X 4.  Objective measurements completed on examination: See above findings.    TREATMENT:  Therapeutic exercise: to centralize symptoms and improve ROM, strength, muscular endurance, and activity tolerance required for successful completion of functional activities.  - MDT lumbar extension in (prone) lying, 2x10 (2nd set with self OP/lock and sag).  - education on use of lumbar roll in sitting, peripheralization and centralization.  - Education on diagnosis, prognosis, POC, anatomy and physiology of current condition.   - Education on HEP including handout    HOME EXERCISE PROGRAM Access Code: [email or web address] URL: [email or web address] Date: 05/22/2020 Prepared by: Norton Blizzard  Exercises Seated Correct Posture Prone Press Up - 6 x daily - 10-15 reps - 1 second hold  Pt required multimodal cuing for proper technique and to facilitate improved neuromuscular  control, strength, range of motion, and functional ability resulting in improved performance and form.   Patient reports she  feels slightly better by end of session compared to when she arrived.      PT Education - 05/22/20 2041    Education Details Exercise purpose/form. Self management techniques. Education on diagnosis, prognosis, POC, anatomy and physiology of current condition Education on HEP including handout    Person(s) Educated Patient    Methods Explanation;Demonstration;Tactile cues;Verbal cues;Handout    Comprehension Verbalized understanding;Returned demonstration;Verbal cues required;Tactile cues required;Need further instruction            PT Short Term Goals - 05/22/20 2027      PT SHORT TERM GOAL #1   Title Be independent with initial home exercise program for self-management of symptoms.    Baseline Initial HEP provided at IE (05/22/2020);    Time 2    Period Weeks    Status New    Target Date 06/05/20             PT Long Term Goals - 05/22/20 2022      PT LONG TERM GOAL #1   Title Be independent with a long-term home exercise program for self-management of symptoms.    Baseline Initial HEP provided at IE (05/22/2020);    Time 12    Period Weeks    Status New   TARGET DATE FOR ALL LONG TERM GOALS: 08/14/2020     PT LONG TERM GOAL #2   Title Demonstrate improved FOTO score to equal or greater than 63 by visit #12 to demonstrate improvement in overall condition and self-reported functional ability    Baseline 49 (05/22/2020);    Time 12    Period Weeks    Status New      PT LONG TERM GOAL #3   Title Have full lumbar AROM with no compensations or increase in pain in all planes except intermittent end range discomfort to allow patient to complete valued activities with less difficulty.    Baseline limited and painful - see objective exam (05/22/2020);    Time 12    Period Weeks    Status New      PT LONG TERM GOAL #4   Title Reduce pain with functional  activities to equal or less than 1/10 to allow patient to complete usual activities including ADLs, IADLs, and educate her children with less difficulty.    Baseline up to 10/10 pain (05/22/2020);    Time 12    Period Weeks    Status New      PT LONG TERM GOAL #5   Title Complete community, work and/or recreational activities without limitation due to current condition.    Baseline Functional Limitations: difficulty with usual activities including cleaning dishes, using bathroom, homeschooling and caring for children, bending, lifting, twisting, sleeping,driving/travleing, dancing, walking, etc (05/22/2020);    Time 12    Period Weeks    Status New                  Plan - 05/22/20 2057    Clinical Impression Statement Patient is a 40 y.o. female referred to outpatient physical therapy with a medical diagnosis of chronic low back pain who presents with signs and symptoms consistent with acute exacerbation of chronic right sided low back pain with right sided radiculopathy. Patient evaluated incorporating MDT method as requested by patient and has provisional MDT classification of lumbar derangement syndrome with extension preference. Patient demonstrated some mild improvements and signs of centralization with lumbar extension in lying exercise that will be further tested over the next few days of  HEP. Patient presents with significant pain, ROM, muscle tension, muscle performance (strength/power/endurance), and activity tolerance impairments that are limiting ability to complete her usual activities including cleaning dishes, using bathroom, homeschooling and caring for children, bending, lifting, twisting, sleeping,driving/travleing, dancing, walking, etc without difficulty. Patient will benefit from skilled physical therapy intervention to address current body structure impairments and activity limitations to improve function and work towards goals set in current POC in order to return to  prior level of function or maximal functional improvement.    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    Examination-Activity Limitations Hygiene/Grooming;Squat;Bed Mobility;Lift;Stairs;Bend;Locomotion Level;Stand;Caring for Others;Toileting;Carry;Sit;Sleep;Dressing;Transfers    Examination-Participation Restrictions Laundry;School;Cleaning;Community Activity;Meal Prep;Shop;Driving;Occupation;Yard Work;Interpersonal Relationship   difficulty with usual activities including cleaning dishes, using bathroom, homeschooling and caring for children, bending, lifting, twisting, sleeping,driving/travleing, dancing, walking, etc.   Stability/Clinical Decision Making Evolving/Moderate complexity    Clinical Decision Making Moderate    Rehab Potential Good    PT Frequency 2x / week    PT Duration 12 weeks    PT Treatment/Interventions ADLs/Self Care Home Management;Cryotherapy;Traction;Moist Heat;Electrical Stimulation;DME Instruction;Therapeutic activities;Therapeutic exercise;Neuromuscular re-education;Manual techniques;Patient/family education;Passive range of motion;Dry needling;Joint Manipulations;Spinal Manipulations    PT Next Visit Plan assess response to HEP and update as appropriate    PT Home Exercise Plan Medbridge Access Code: [email or web address]    Consulted and Agree with Plan of Care Patient           Patient will benefit from skilled therapeutic intervention in order to improve the following deficits and impairments:  Pain,Decreased mobility,Increased muscle spasms,Decreased activity tolerance,Decreased endurance,Decreased range of motion,Decreased strength,Hypomobility,Impaired perceived functional ability,Difficulty walking,Impaired flexibility  Visit Diagnosis: Chronic right-sided low back pain with  right-sided sciatica  Difficulty in walking, not elsewhere classified     Problem List Patient Active Problem List   Diagnosis Date Noted  . Labor and delivery, indication for care 09/03/2017  . Pelvic cramping 04/16/2016  . First trimester bleeding 04/16/2016  . Vulvar itching 04/16/2016  . Vaginal bleeding 04/16/2016  . H/O female dyspareunia 02/06/2016  . Family history of endometriosis 02/06/2016  . History of dysmenorrhea 02/06/2016  . Overweight (BMI 25.0-29.9) 02/06/2016  . GBS carrier 02/06/2016    Luretha Murphy. Ilsa Iha, PT, DPT 05/22/20, 9:00 PM  [facility] PHYSICAL AND SPORTS MEDICINE [address], [place], [address] Phone: [phone]   Fax:  [phone]  Name: Sheila Baker MRN: [id number] Date of Birth: November 01, 1980

## 2020-05-27 ENCOUNTER — Ambulatory Visit: Payer: 59 | Admitting: Physical Therapy

## 2020-05-29 ENCOUNTER — Ambulatory Visit: Payer: 59 | Admitting: Physical Therapy

## 2020-05-29 ENCOUNTER — Other Ambulatory Visit: Payer: Self-pay

## 2020-05-29 ENCOUNTER — Encounter: Payer: Self-pay | Admitting: Physical Therapy

## 2020-05-29 DIAGNOSIS — G8929 Other chronic pain: Secondary | ICD-10-CM

## 2020-05-29 DIAGNOSIS — M5441 Lumbago with sciatica, right side: Secondary | ICD-10-CM | POA: Diagnosis not present

## 2020-05-29 DIAGNOSIS — R262 Difficulty in walking, not elsewhere classified: Secondary | ICD-10-CM

## 2020-05-29 NOTE — Therapy (Signed)
[facility] PHYS[facility] AND SPORTS MEDICINE [address], [place], [address] Phone: [phone]   Fax:  [phone]  Physical Therapy Treatment  Patient Details  Name: Sheila Baker MRN: [id number] Date of Birth: 12-16-1980 Referring Provider (PT): Susa Loffler, MD (neurology)   Encounter Date: 05/29/2020   PT End of Session - 05/29/20 1838    Visit Number 2    Number of Visits 24    Date for PT Re-Evaluation 08/14/20    Authorization Type [employer] reporting period from 05/22/2020    Authorization Time Period 60 visits remain OT/PT/Chiro (15 used when insurance verified prior to IE) No Pre Cert [facility] [id number]    Authorization - Visit Number 2    Authorization - Number of Visits 45    Progress Note Due on Visit 10    PT Start Time 1737    PT Stop Time 1815    PT Time Calculation (min) 38 min    Activity Tolerance Patient tolerated treatment well    Behavior During Therapy [facility] for tasks assessed/performed           Past Medical History:  Diagnosis Date  . Acid reflux   . Environmental allergies     Past Surgical History:  Procedure Laterality Date  . SEPTOPLASTY    . WRIST SURGERY      There were no vitals filed for this visit.   Subjective Assessment - 05/29/20 1739    Subjective Pateint reports she continues to have pain in her right low back and leg to posterior thigh (sometimes goes down to mid calf and foot). Currently 5/10. Patient reports she has been doing only prone press ups but not as often as prescribed. Has done up to 4x a day, and thought that might have helped. Is having a lot of pain in the R leg when she wakes up. Also is too busy to come more than about 1x a week currently. Also got a new puppy (labradoodle) since last treatment session.    Pertinent History Patient is a 40 y.o. female who presents to outpatient physical therapy with a referral for medical diagnosis chronic low back pain. This patient's chief  complaints consist of exacerbation of chronic right sided low back pain with radiation down the posterior R LE to the calf leading to the following functional deficits: difficulty with usual activities including cleaning dishes, using bathroom, homeschooling and caring for children, bending, lifting, twisting, sleeping,driving/travleing, dancing, walking, etc.  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  Patient denies hx of cancer, stroke, seizures, lung problem, major cardiac events, diabetes, unexplained weight loss, changes in bowel or bladder problems, new onset stumbling or dropping things, Back surgery.    Limitations Sitting;Reading;Lifting;Standing;Walking;House hold activities;Other (comment)   difficulty with usual activities including cleaning dishes, using bathroom, homeschooling and caring for children, bending, lifting, twisting, sleeping,driving/travleing, dancing, walking, etc.   Diagnostic tests Lumbar MRI 04/12/2020: "IMPRESSION:  1. Small right central disc protrusion at L5-S1 abutting the  traversing right S1 nerve root.  2. No spinal canal or neural foraminal stenosis at any level."    Currently in Pain? Yes    Pain Score 5     Pain Location Back    Pain Orientation Right;Lower    Pain Radiating Towards back of R leg to mid thigh    Pain Onset More than a month ago  TREATMENT:  Therapeutic exercise:to centralize symptoms and improve ROM, strength, muscular endurance, and activity tolerance required for successful completion of functional activities.  - MDT lumbar extension in (prone) lying, 2x10 (2nd set with yoga blocks under hands),  (manual therapy - see below) - MDT lumbar extension in (prone) lying with yoga blocks under hands and lock and sag, 1x10  (leg pain gone) - standing lumbar extension 1x10 (difficulty getting to end range) - standing lumbar extension over  table (improved end range).  - lifting practice/assessment/advice. From 17 inch chair: 12/26/28# KB, from floor: 10#KB 1x3. Education on proper form and squat vs dead lift options. To improve patient's ability to pick up her daughter and puppy with good body mechanics.  - standing lumbar extension 1x7 (pain down back of R thigh during) - MDT lumbar extension in (prone) lying with yoga blocks under hands and lock and sag, 1x10 (pain in leg gone) - education on how to start performing non-aggravating UE activities and walking. How to assess whether something is worsening her condition or not (during/after/nextday).   Manual therapy: to reduce pain and tissue tension, improve range of motion, neuromodulation, in order to promote improved ability to complete functional activities. - prone CPA mid to lumbar region focusing on most tender levels (L3-L4), grade III-IV.  - prone STM to R glute region (painful and tight) .  Pt required multimodal cuing for proper technique and to facilitate improved neuromuscular control, strength, range of motion, and functional ability resulting in improved performance and form.    HOME EXERCISE PROGRAM Access Code: [email or web address] URL: [email or web address] Date: 05/22/2020 Prepared by: Norton BlizzardSara Leyli Kevorkian  Exercises Seated Correct Posture Prone Press Up - 6 x daily - 10-15 reps - 1 second hold  Pt required multimodal cuing for proper technique and to facilitate improved neuromuscular control, strength, range of motion, and functional ability resulting in improved performance and form.      PT Education - 05/29/20 1837    Education Details Exercise purpose/form. Self management techniques.    Person(s) Educated Patient    Methods Explanation;Demonstration;Tactile cues;Verbal cues    Comprehension Verbalized understanding;Returned demonstration;Verbal cues required;Tactile cues required;Need further instruction            PT Short Term Goals -  05/22/20 2027      PT SHORT TERM GOAL #1   Title Be independent with initial home exercise program for self-management of symptoms.    Baseline Initial HEP provided at IE (05/22/2020);    Time 2    Period Weeks    Status New    Target Date 06/05/20             PT Long Term Goals - 05/22/20 2022      PT LONG TERM GOAL #1   Title Be independent with a long-term home exercise program for self-management of symptoms.    Baseline Initial HEP provided at IE (05/22/2020);    Time 12    Period Weeks    Status New   TARGET DATE FOR ALL LONG TERM GOALS: 08/14/2020     PT LONG TERM GOAL #2   Title Demonstrate improved FOTO score to equal or greater than 63 by visit #12 to demonstrate improvement in overall condition and self-reported functional ability    Baseline 49 (05/22/2020);    Time 12    Period Weeks    Status New      PT LONG TERM GOAL #3   Title Have full lumbar AROM with no compensations or  increase in pain in all planes except intermittent end range discomfort to allow patient to complete valued activities with less difficulty.    Baseline limited and painful - see objective exam (05/22/2020);    Time 12    Period Weeks    Status New      PT LONG TERM GOAL #4   Title Reduce pain with functional activities to equal or less than 1/10 to allow patient to complete usual activities including ADLs, IADLs, and educate her children with less difficulty.    Baseline up to 10/10 pain (05/22/2020);    Time 12    Period Weeks    Status New      PT LONG TERM GOAL #5   Title Complete community, work and/or recreational activities without limitation due to current condition.    Baseline Functional Limitations: difficulty with usual activities including cleaning dishes, using bathroom, homeschooling and caring for children, bending, lifting, twisting, sleeping,driving/travleing, dancing, walking, etc (05/22/2020);    Time 12    Period Weeks    Status New                 Plan -  05/29/20 1847    Clinical Impression Statement Patient reports she feels slightly better by end of session compared to when she arrived with no pain in the leg. Back sore by end of session. Continues to respond to repeated extension with at least temporary centralization. Appears to need more frequent, deeper range of motion, and/or more repetitions with less flexion throughout day. Will continue efforts to improve pain over time with extension based exercise over the next week and re-assess at next visit. Did seem to benefit from some manual therapy as well and would likely benefit from core strengthening/motor control exercises due to high level of flexibility, history as a Horticulturist, commercial, and age. Patient would benefit from continued management of limiting condition by skilled physical therapist to address remaining impairments and functional limitations to work towards stated goals and return to PLOF or maximal functional independence.    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    Examination-Activity Limitations Hygiene/Grooming;Squat;Bed Mobility;Lift;Stairs;Bend;Locomotion Level;Stand;Caring for Others;Toileting;Carry;Sit;Sleep;Dressing;Transfers    Examination-Participation Restrictions Laundry;School;Cleaning;Community Activity;Meal Prep;Shop;Driving;Occupation;Yard Work;Interpersonal Relationship   difficulty with usual activities including cleaning dishes, using bathroom, homeschooling and caring for children, bending, lifting, twisting, sleeping,driving/travleing, dancing, walking, etc.   Stability/Clinical Decision Making Evolving/Moderate complexity    Rehab Potential Good    PT Frequency 2x / week    PT Duration 12 weeks    PT Treatment/Interventions ADLs/Self Care Home Management;Cryotherapy;Traction;Moist  Heat;Electrical Stimulation;DME Instruction;Therapeutic activities;Therapeutic exercise;Neuromuscular re-education;Manual techniques;Patient/family education;Passive range of motion;Dry needling;Joint Manipulations;Spinal Manipulations    PT Next Visit Plan assess response to HEP and update as appropriate    PT Home Exercise Plan Medbridge Access Code: [email or web address]    Consulted and Agree with Plan of Care Patient           Patient will benefit from skilled therapeutic intervention in order to improve the following deficits and impairments:  Pain,Decreased mobility,Increased muscle spasms,Decreased activity tolerance,Decreased endurance,Decreased range of motion,Decreased strength,Hypomobility,Impaired perceived functional ability,Difficulty walking,Impaired flexibility  Visit Diagnosis: Chronic right-sided low back pain with right-sided sciatica  Difficulty in walking, not elsewhere classified     Problem List Patient Active Problem List   Diagnosis Date Noted  . Labor and delivery, indication for care 09/03/2017  . Pelvic  cramping 04/16/2016  . First trimester bleeding 04/16/2016  . Vulvar itching 04/16/2016  . Vaginal bleeding 04/16/2016  . H/O female dyspareunia 02/06/2016  . Family history of endometriosis 02/06/2016  . History of dysmenorrhea 02/06/2016  . Overweight (BMI 25.0-29.9) 02/06/2016  . GBS carrier 02/06/2016    Luretha Murphy. Ilsa Iha, PT, DPT 05/29/20, 6:48 PM  [facility] PHYSICAL AND SPORTS MEDICINE [address], [place], [address] Phone: [phone]   Fax:  [phone]  Name: Sheila Baker MRN: [id number] Date of Birth: [birth year]

## 2020-06-03 ENCOUNTER — Encounter: Payer: 59 | Admitting: Physical Therapy

## 2020-06-05 ENCOUNTER — Other Ambulatory Visit: Payer: Self-pay

## 2020-06-05 ENCOUNTER — Ambulatory Visit: Payer: 59 | Admitting: Physical Therapy

## 2020-06-05 ENCOUNTER — Encounter: Payer: Self-pay | Admitting: Physical Therapy

## 2020-06-05 DIAGNOSIS — M5441 Lumbago with sciatica, right side: Secondary | ICD-10-CM | POA: Diagnosis not present

## 2020-06-05 DIAGNOSIS — G8929 Other chronic pain: Secondary | ICD-10-CM

## 2020-06-05 DIAGNOSIS — R262 Difficulty in walking, not elsewhere classified: Secondary | ICD-10-CM

## 2020-06-05 NOTE — Therapy (Signed)
[facility]TS [facility]IC[facility] [address], [place], [address] Phone: [phone]   Fax:  [phone]  Physical Therapy Treatment  Patient Details  Name: Sheila Baker MRN: [id number] Date of Birth: 05/02/1980 Referring Provider (PT): Cristopher Peru, MD (neurology)   Encounter Date: 06/05/2020   PT End of Session - 06/05/20 1811    Visit Number 3    Number of Visits 24    Date for PT Re-Evaluation 08/14/20    Authorization Type [employer] reporting period from 05/22/2020    Authorization Time Period 60 visits remain OT/PT/Chiro (15 used when insurance verified prior to IE) No Pre Cert [facility] [id number]    Authorization - Visit Number 3    Authorization - Number of Visits 45    Progress Note Due on Visit 10    PT Start Time 1734    PT Stop Time 1814    PT Time Calculation (min) 40 min    Activity Tolerance Patient tolerated treatment well    Behavior During Therapy [facility] for tasks assessed/performed            Past Medical History:  Diagnosis Date  . Acid reflux   . Environmental allergies     Past Surgical History:  Procedure Laterality Date  . SEPTOPLASTY    . WRIST SURGERY      There were no vitals filed for this visit.   Subjective Assessment - 06/05/20 1735    Subjective Patinet reports she is feeling better but continues to have trouble with prolonged sitting. She is doing the exercises a bit more often and also doing more standing lumbar extension. She has 2/10 pain upon arrival and she is not feeling it down her leg as bad as when she first came in. None in her leg currently. Felt it there last time last week. She also had pain when sitting 1.5 hours without lumbar roll.    Pertinent History Patient is a 40 y.o. female who presents to outpatient physical therapy with a referral for medical diagnosis chronic low back pain. This patient's chief complaints consist of exacerbation of chronic right sided low back pain with  radiation down the posterior R LE to the calf leading to the following functional deficits: difficulty with usual activities including cleaning dishes, using bathroom, homeschooling and caring for children, bending, lifting, twisting, sleeping,driving/travleing, dancing, walking, etc.  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  Patient denies hx of cancer, stroke, seizures, lung problem, major cardiac events, diabetes, unexplained weight loss, changes in bowel or bladder problems, new onset stumbling or dropping things, Back surgery.    Limitations Sitting;Reading;Lifting;Standing;Walking;House hold activities;Other (comment)   difficulty with usual activities including cleaning dishes, using bathroom, homeschooling and caring for children, bending, lifting, twisting, sleeping,driving/travleing, dancing, walking, etc.   Diagnostic tests Lumbar MRI 04/12/2020: "IMPRESSION:  1. Small right central disc protrusion at L5-S1 abutting the  traversing right S1 nerve root.  2. No spinal canal or neural foraminal stenosis at any level."    Currently in Pain? Yes    Pain Score 2     Pain Location Back    Pain Orientation Right    Pain Onset More than a month ago           TREATMENT: Therapeutic exercise:to centralize symptoms and improve ROM, strength, muscular endurance, and activity tolerance required for successful completion of functional activities. -  MDT lumbar extension in (prone) lying with yoga blocks under hands, 2x10, - MDT lumbar extension in (prone) lying with yoga blocks under hands and lock and sag, 1x10  - walking to assess response to exercises (improved)  (manual therapy/dry needling - see below)  - walking to assess response to needling/manual (decreased pain) - MDT lumbar extension in (prone) lying with yoga blocks under hands and lock and sag, 1x10     Manual therapy: to reduce pain and  tissue tension, improve range of motion, neuromodulation, in order to promote improved ability to complete functional activities. - prone STM to lumbar paraspinals R > L - prone STM to R glute region (less tender) .  Modality: (unbilled) Dry needling performed to lumbar extensors to decrease pain and spasms along patient's low back and right leg/glute region with patient in prone utilizing (2) dry needle(s) .27mm x 66mm with (2) sticks at right lumbar multifidi at approximately L5 and L4 levels . Patient educated about the risks and benefits from therapy and verbally consents to treatment.  Dry needling performed by Luretha Murphy. Ilsa Iha PT, DPT who is certified in this technique.  Pt required multimodal cuing for proper technique and to facilitate improved neuromuscular control, strength, range of motion, and functional ability resulting in improved performance and form.   HOME EXERCISE PROGRAM Access Code: [email or web address] URL: [email or web address] Date: 05/22/2020 Prepared by: Norton Blizzard  Exercises Seated Correct Posture Prone Press Up - 6 x daily - 10-15 reps - 1 second hold  Pt required multimodal cuing for proper technique and to facilitate improved neuromuscular control, strength, range of motion, and functional ability resulting in improved performance and form.    PT Education - 06/05/20 1925    Education Details Exercise purpose/form. Self management techniques.    Person(s) Educated Patient    Methods Explanation;Demonstration;Tactile cues;Verbal cues    Comprehension Verbalized understanding;Returned demonstration;Verbal cues required;Tactile cues required;Need further instruction            PT Short Term Goals - 05/22/20 2027      PT SHORT TERM GOAL #1   Title Be independent with initial home exercise program for self-management of symptoms.    Baseline Initial HEP provided at IE (05/22/2020);    Time 2    Period Weeks    Status New    Target Date 06/05/20              PT Long Term Goals - 05/22/20 2022      PT LONG TERM GOAL #1   Title Be independent with a long-term home exercise program for self-management of symptoms.    Baseline Initial HEP provided at IE (05/22/2020);    Time 12    Period Weeks    Status New   TARGET DATE FOR ALL LONG TERM GOALS: 08/14/2020     PT LONG TERM GOAL #2   Title Demonstrate improved FOTO score to equal or greater than 63 by visit #12 to demonstrate improvement in overall condition and self-reported functional ability    Baseline 49 (05/22/2020);    Time 12    Period Weeks    Status New      PT LONG TERM GOAL #3   Title Have full lumbar AROM with no compensations or increase in pain in all planes except intermittent end range discomfort to allow patient to complete valued activities with less difficulty.    Baseline limited and painful - see objective exam (05/22/2020);    Time 12  Period Weeks    Status New      PT LONG TERM GOAL #4   Title Reduce pain with functional activities to equal or less than 1/10 to allow patient to complete usual activities including ADLs, IADLs, and educate her children with less difficulty.    Baseline up to 10/10 pain (05/22/2020);    Time 12    Period Weeks    Status New      PT LONG TERM GOAL #5   Title Complete community, work and/or recreational activities without limitation due to current condition.    Baseline Functional Limitations: difficulty with usual activities including cleaning dishes, using bathroom, homeschooling and caring for children, bending, lifting, twisting, sleeping,driving/travleing, dancing, walking, etc (05/22/2020);    Time 12    Period Weeks    Status New                 Plan - 06/05/20 1932    Clinical Impression Statement Patient appears to be continuing to respond well to extension based specific exercise. Added dry needling this session to improve comfort of extension based exercise and decrease pain in general. Patient cued in  improved techniques and pain appears to be largely centralized to low back. Plan to start some core exercises next session as tolerated. Patient would benefit from continued management of limiting condition by skilled physical therapist to address remaining impairments and functional limitations to work towards stated goals and return to PLOF or maximal functional independence.    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    Examination-Activity Limitations Hygiene/Grooming;Squat;Bed Mobility;Lift;Stairs;Bend;Locomotion Level;Stand;Caring for Others;Toileting;Carry;Sit;Sleep;Dressing;Transfers    Examination-Participation Restrictions Laundry;School;Cleaning;Community Activity;Meal Prep;Shop;Driving;Occupation;Yard Work;Interpersonal Relationship   difficulty with usual activities including cleaning dishes, using bathroom, homeschooling and caring for children, bending, lifting, twisting, sleeping,driving/travleing, dancing, walking, etc.   Stability/Clinical Decision Making Evolving/Moderate complexity    Rehab Potential Good    PT Frequency 2x / week    PT Duration 12 weeks    PT Treatment/Interventions ADLs/Self Care Home Management;Cryotherapy;Traction;Moist Heat;Electrical Stimulation;DME Instruction;Therapeutic activities;Therapeutic exercise;Neuromuscular re-education;Manual techniques;Patient/family education;Passive range of motion;Dry needling;Joint Manipulations;Spinal Manipulations    PT Next Visit Plan assess response to HEP and update as appropriate    PT Home Exercise Plan Medbridge Access Code: [email or web address]    Consulted and Agree with Plan of Care Patient           Patient will benefit from skilled therapeutic intervention in order to improve the following deficits and impairments:  Pain,Decreased  mobility,Increased muscle spasms,Decreased activity tolerance,Decreased endurance,Decreased range of motion,Decreased strength,Hypomobility,Impaired perceived functional ability,Difficulty walking,Impaired flexibility  Visit Diagnosis: Chronic right-sided low back pain with right-sided sciatica  Difficulty in walking, not elsewhere classified     Problem List Patient Active Problem List   Diagnosis Date Noted  . Labor and delivery, indication for care 09/03/2017  . Pelvic cramping 04/16/2016  . First trimester bleeding 04/16/2016  . Vulvar itching 04/16/2016  . Vaginal bleeding 04/16/2016  . H/O female dyspareunia 02/06/2016  . Family history of endometriosis 02/06/2016  . History of dysmenorrhea 02/06/2016  . Overweight (BMI 25.0-29.9) 02/06/2016  . GBS carrier 02/06/2016    Luretha Murphy. Ilsa Iha, PT, DPT 06/05/20, 7:34 PM  [facility] PHYSICAL AND SPORTS MEDICINE [address], [place], [address] Phone: [phone]   Fax:  [phone]  Name: Sheila Baker MRN: [id number] Date of Birth:  10/24/1980   

## 2020-06-10 ENCOUNTER — Encounter: Payer: 59 | Admitting: Physical Therapy

## 2020-06-12 ENCOUNTER — Ambulatory Visit: Payer: 59 | Attending: Neurology | Admitting: Physical Therapy

## 2020-06-12 ENCOUNTER — Other Ambulatory Visit: Payer: Self-pay

## 2020-06-12 ENCOUNTER — Encounter: Payer: Self-pay | Admitting: Physical Therapy

## 2020-06-12 DIAGNOSIS — M5441 Lumbago with sciatica, right side: Secondary | ICD-10-CM | POA: Diagnosis not present

## 2020-06-12 DIAGNOSIS — R262 Difficulty in walking, not elsewhere classified: Secondary | ICD-10-CM | POA: Diagnosis present

## 2020-06-12 DIAGNOSIS — G8929 Other chronic pain: Secondary | ICD-10-CM | POA: Insufficient documentation

## 2020-06-12 NOTE — Therapy (Addendum)
[place] [facility]OR[facility] MEDICINE [address] [place], [address] Phone: [phone]   Fax:  [phone]  Physical Therapy Treatment  Patient Details  Name: Sheila Baker MRN: [id number] Date of Birth: 12-22-1980 Referring Provider (PT): Cristopher Peru, MD (neurology)   Encounter Date: 06/12/2020   PT End of Session - 06/12/20 1931    Visit Number 4    Number of Visits 24    Date for PT Re-Evaluation 08/14/20    Authorization Type [employer] reporting period from 05/22/2020    Authorization Time Period 60 visits remain OT/PT/Chiro (15 used when insurance verified prior to IE) No Pre Cert [facility] [id number]    Authorization - Visit Number 4    Authorization - Number of Visits 45    Progress Note Due on Visit 10    PT Start Time 1820    PT Stop Time 1900    PT Time Calculation (min) 40 min    Activity Tolerance Patient tolerated treatment well    Behavior During Therapy [facility] for tasks assessed/performed           Past Medical History:  Diagnosis Date  . Acid reflux   . Environmental allergies     Past Surgical History:  Procedure Laterality Date  . SEPTOPLASTY    . WRIST SURGERY      There were no vitals filed for this visit.   Subjective Assessment - 06/12/20 1820    Subjective Pateint reports her pain is no longer in her leg and cannot rememeber it being there since she was here last. She has pain in her low back in the middle of 2/10. She has pain in her thoracic spine of about 4/10. States today is worse than past days. States she deals with the thoracic pain for a long time. Has not been able to do her HEP as much as she would like. She states she has been doing the standing extension about 3 times a day.    Pertinent History Patient is a 40 y.o. female who presents to outpatient physical therapy with a referral for medical diagnosis chronic low back pain. This patient's chief complaints consist of exacerbation of chronic  right sided low back pain with radiation down the posterior R LE to the calf leading to the following functional deficits: difficulty with usual activities including cleaning dishes, using bathroom, homeschooling and caring for children, bending, lifting, twisting, sleeping,driving/travleing, dancing, walking, etc.  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  Patient denies hx of cancer, stroke, seizures, lung problem, major cardiac events, diabetes, unexplained weight loss, changes in bowel or bladder problems, new onset stumbling or dropping things, Back surgery.    Limitations Sitting;Reading;Lifting;Standing;Walking;House hold activities;Other (comment)   difficulty with usual activities including cleaning dishes, using bathroom, homeschooling and caring for children, bending, lifting, twisting, sleeping,driving/travleing, dancing, walking, etc.   Diagnostic tests Lumbar MRI 04/12/2020: "IMPRESSION:  1. Small right central disc protrusion at L5-S1 abutting the  traversing right S1 nerve root.  2. No spinal canal or neural foraminal stenosis at any level."    Currently in Pain? Yes    Pain Score 2     Pain Location Back    Pain Onset More than a month ago          OBJECTIVE FOTO = 57   TREATMENT:  Therapeutic exercise:to centralize symptoms and improve ROM, strength, muscular  endurance, and activity tolerance required for successful completion of functional activities. - MDT lumbar extension in (prone) lying, 1x10 (does not hurt) - seated thoracic extension over back of chair, 2x10 (with and without airex pad) - seated AAROM thoracic extension with fingers interlocked behind neck,  - Standing cervical thoracic extension/BUE flexion and serratus anterior activation, lat stretch, with foam roller up wall, 2 seconds. 1x10 - Standing rows with scapular retraction for improved postural and shoulder girdle  strengthening and mobility. 1x10 with green theraband.  - MDT lumbar extension in (prone) lying with yoga blocks under hands, 1x10 - hooklying abdominal brace, 5 seconds, 1x5 - hooklying abdominal brace with marching, 1x10 each side - hooklying abdominal brace with alternating LE extension, 10 each side - hooklying abdominal brace with neutral spine bridge, 1x20 - quadruped bird dog, 1x10 each side with cone over back - Education on HEP including handout   Pt required multimodal cuing for proper technique and to facilitate improved neuromuscular control, strength, range of motion, and functional ability resulting in improved performance and form.   HOME EXERCISE PROGRAM Access Code: [email or web address] URL: [email or web address] Date: 06/12/2020 Prepared by: Norton Blizzard  Exercises Seated Correct Posture Prone Press Up - 6 x daily - 10-15 reps - 1 second hold Standing Lumbar Extension - 4 x daily - 10-15 reps - 1 sets - 1 second hold Standing Lumbar Extension with Counter - 4 x daily - 10-15 reps - 1 sets - 1 second hold Supine Transversus Abdominis Bracing with Leg Extension - 1 x daily - 1-2 sets - 20 reps - 2-5 seconds hold Bridge - 1 x daily - 1 sets - 20 reps - 2-5 seconds hold Bird Dog - 1 x daily - 1 sets - 20 reps Seated Thoracic Lumbar Extension - 2 x daily - 1-2 sets - 10-15 reps - 2 seconds hold Row with band/cable - 3-7 x weekly - 3 sets - 10-20 reps  [email or web address] [id number] Home Exercise Program [[email or web address]]  Thoracic Extension  -  Repeat 10 Times, Hold 3 Seconds, Complete 1 Set, Perform 8 Times a Day   PT Education - 06/12/20 1931    Education Details Exercise purpose/form. Self management techniques.    Person(s) Educated Patient    Methods Explanation;Demonstration;Tactile cues;Verbal cues    Comprehension Verbalized understanding;Returned demonstration;Verbal cues required;Tactile cues required;Need further instruction            PT Short Term Goals -  06/12/20 1933      PT SHORT TERM GOAL #1   Title Be independent with initial home exercise program for self-management of symptoms.    Baseline Initial HEP provided at IE (05/22/2020);    Time 2    Period Weeks    Status Achieved    Target Date 06/05/20             PT Long Term Goals - 05/22/20 2022      PT LONG TERM GOAL #1   Title Be independent with a long-term home exercise program for self-management of symptoms.    Baseline Initial HEP provided at IE (05/22/2020);    Time 12    Period Weeks    Status New   TARGET DATE FOR ALL LONG TERM GOALS: 08/14/2020     PT LONG TERM GOAL #2   Title Demonstrate improved FOTO score to equal or greater than 63 by visit #12 to demonstrate improvement in overall condition and self-reported functional ability    Baseline 49 (05/22/2020);  Time 12    Period Weeks    Status New      PT LONG TERM GOAL #3   Title Have full lumbar AROM with no compensations or increase in pain in all planes except intermittent end range discomfort to allow patient to complete valued activities with less difficulty.    Baseline limited and painful - see objective exam (05/22/2020);    Time 12    Period Weeks    Status New      PT LONG TERM GOAL #4   Title Reduce pain with functional activities to equal or less than 1/10 to allow patient to complete usual activities including ADLs, IADLs, and educate her children with less difficulty.    Baseline up to 10/10 pain (05/22/2020);    Time 12    Period Weeks    Status New      PT LONG TERM GOAL #5   Title Complete community, work and/or recreational activities without limitation due to current condition.    Baseline Functional Limitations: difficulty with usual activities including cleaning dishes, using bathroom, homeschooling and caring for children, bending, lifting, twisting, sleeping,driving/travleing, dancing, walking, etc (05/22/2020);    Time 12    Period Weeks    Status New                 Plan  - 06/12/20 1932    Clinical Impression Statement Patient tolerated treatment well overall with slight reduction in thoracic spine pain and feeling of increased mobility after stretches and rows. Introduced core strengthening exercises for recovery of function which was well tolerated with some report of more discomfort on right compared to left lower back with bird dog. Would like to do dry needling next session. Patient would benefit from continued management of limiting condition by skilled physical therapist to address remaining impairments and functional limitations to work towards stated goals and return to PLOF or maximal functional independence.    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    Examination-Activity Limitations Hygiene/Grooming;Squat;Bed Mobility;Lift;Stairs;Bend;Locomotion Level;Stand;Caring for Others;Toileting;Carry;Sit;Sleep;Dressing;Transfers    Examination-Participation Restrictions Laundry;School;Cleaning;Community Activity;Meal Prep;Shop;Driving;Occupation;Yard Work;Interpersonal Relationship   difficulty with usual activities including cleaning dishes, using bathroom, homeschooling and caring for children, bending, lifting, twisting, sleeping,driving/travleing, dancing, walking, etc.   Stability/Clinical Decision Making Evolving/Moderate complexity    Rehab Potential Good    PT Frequency 2x / week    PT Duration 12 weeks    PT Treatment/Interventions ADLs/Self Care Home Management;Cryotherapy;Traction;Moist Heat;Electrical Stimulation;DME Instruction;Therapeutic activities;Therapeutic exercise;Neuromuscular re-education;Manual techniques;Patient/family education;Passive range of motion;Dry needling;Joint Manipulations;Spinal Manipulations    PT Next Visit Plan specific exercise, core  strengthening, recovery of function, consider dry needling,    PT Home Exercise Plan Medbridge Access Code: [email or web address]    Consulted and Agree with Plan of Care Patient           Patient will benefit from skilled therapeutic intervention in order to improve the following deficits and impairments:  Pain,Decreased mobility,Increased muscle spasms,Decreased activity tolerance,Decreased endurance,Decreased range of motion,Decreased strength,Hypomobility,Impaired perceived functional ability,Difficulty walking,Impaired flexibility  Visit Diagnosis: Chronic right-sided low back pain with right-sided sciatica  Difficulty in walking, not elsewhere classified     Problem List Patient Active Problem List   Diagnosis Date Noted  . Labor and delivery, indication for care 09/03/2017  . Pelvic cramping 04/16/2016  . First trimester bleeding 04/16/2016  . Vulvar itching 04/16/2016  .  Vaginal bleeding 04/16/2016  . H/O female dyspareunia 02/06/2016  . Family history of endometriosis 02/06/2016  . History of dysmenorrhea 02/06/2016  . Overweight (BMI 25.0-29.9) 02/06/2016  . GBS carrier 02/06/2016    Luretha Murphy. Ilsa Iha, PT, DPT 06/12/20, 7:34 PM  [facility] PHYSICAL AND SPORTS MEDICINE [address], [place], [address] Phone: [phone]   Fax:  [phone]  Name: Sheila Baker MRN: [id number] Date of Birth: 09-21-1980

## 2020-06-17 ENCOUNTER — Encounter: Payer: 59 | Admitting: Physical Therapy

## 2020-06-19 ENCOUNTER — Ambulatory Visit: Payer: 59 | Admitting: Physical Therapy

## 2020-06-19 ENCOUNTER — Other Ambulatory Visit: Payer: Self-pay

## 2020-06-19 ENCOUNTER — Encounter: Payer: Self-pay | Admitting: Physical Therapy

## 2020-06-19 DIAGNOSIS — G8929 Other chronic pain: Secondary | ICD-10-CM

## 2020-06-19 DIAGNOSIS — R262 Difficulty in walking, not elsewhere classified: Secondary | ICD-10-CM

## 2020-06-19 DIAGNOSIS — M5441 Lumbago with sciatica, right side: Secondary | ICD-10-CM | POA: Diagnosis not present

## 2020-06-19 NOTE — Therapy (Signed)
[place] [facility] [address] [place], [address] Phone: [phone]   Fax:  [phone]  Physical Therapy Treatment  Patient Details  Name: Sheila Baker MRN: [id number] Date of Birth: February 21, 1981 Referring Provider (PT): Cristopher Peru, MD (neurology)   Encounter Date: 06/19/2020   PT End of Session - 06/19/20 1905    Visit Number 5    Number of Visits 24    Date for PT Re-Evaluation 08/14/20    Authorization Type [employer] reporting period from 05/22/2020    Authorization Time Period 60 visits remain OT/PT/Chiro (15 used when insurance verified prior to IE) No Pre Cert [facility] [id number]    Authorization - Visit Number 5    Authorization - Number of Visits 45    Progress Note Due on Visit 10    PT Start Time 1817    PT Stop Time 1857    PT Time Calculation (min) 40 min    Activity Tolerance Patient tolerated treatment well    Behavior During Therapy [facility] for tasks assessed/performed           Past Medical History:  Diagnosis Date  . Acid reflux   . Environmental allergies     Past Surgical History:  Procedure Laterality Date  . SEPTOPLASTY    . WRIST SURGERY      There were no vitals filed for this visit.   Subjective Assessment - 06/19/20 1819    Subjective Patient reports she had a lot of sciatic pain on Friday that started while she was out at [employer] shopping with her daughter and over the weekend cleaning. Her back pain and sciatic pain is better now. Her upper back is bothering her 5/10 in the right upper back (mildly on left too). States she has some muscle relaxants and they made her really sleepy and nauseous when she took half.  Did some press ups and thinks this helped her low back and sciatica and rested to help her pain. Exercises for upper back were hard to do at home. Would like to do dry needling today.    Pertinent History Patient is a 40 y.o. female who presents to outpatient physical therapy  with a referral for medical diagnosis chronic low back pain. This patient's chief complaints consist of exacerbation of chronic right sided low back pain with radiation down the posterior R LE to the calf leading to the following functional deficits: difficulty with usual activities including cleaning dishes, using bathroom, homeschooling and caring for children, bending, lifting, twisting, sleeping,driving/travleing, dancing, walking, etc.  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  Patient denies hx of cancer, stroke, seizures, lung problem, major cardiac events, diabetes, unexplained weight loss, changes in bowel or bladder problems, new onset stumbling or dropping things, Back surgery.    Limitations Sitting;Reading;Lifting;Standing;Walking;House hold activities;Other (comment)   difficulty with usual activities including cleaning dishes, using bathroom, homeschooling and caring for children, bending, lifting, twisting, sleeping,driving/travleing, dancing, walking, etc.   Diagnostic tests Lumbar MRI 04/12/2020: "IMPRESSION:  1. Small right central disc protrusion at L5-S1 abutting the  traversing right S1 nerve root.  2. No spinal canal or neural foraminal stenosis at any level."    Currently in Pain? Yes    Pain Score 5     Pain Location Thoracic    Pain Orientation Right    Pain Onset More than a month ago  TREATMENT:  Therapeutic exercise:to centralize symptoms and improve ROM, strength, muscular endurance, and activity tolerance required for successful completion of functional activities. - MDT lumbar extension with lock and sag in (prone) lying, 3x10 (does not hurt) - MDT thoracic extension in (prone) lying with/without lock and sag, 1x15 - MDT lumbar extension with clinician overpressure x 10 (difficult).  - seated lumbo/thoracic AROM and with clinician OP, R and L to assess  restriction/response. No effect on upper back pain. Seems slighlty restricted to R compared to left - seated lumbo/thoracic R rotation with clinician overpressure x 6   Manual therapy:to reduce pain and tissue tension, improve range of motion, neuromodulation, in order to promote improved ability to complete functional activities. - prone STM to R lumbar and thoracic paraspinals (tender) - prone STM to R glute region (tender) - CPA along lumbar and thoracic spine grade III-IV - R UPA grade III-IV at lowest lumbar segments    Modality: (unbilled) Dry needling performed to lumbar extensors to decrease pain and spasms along patient's low back and right leg/glute region with patient in prone utilizing (2) dry needle(s) .3mm x 75mm with (2) sticks at right lumbar multifidi at approximately L5 and L4 levels . Patient educated about the risks and benefits from therapy and verbally consents to treatment. Dry needling performed by Luretha MurphySara R. Ilsa IhaSnyder PT, DPT who is certified in this technique.  Pt required multimodal cuing for proper technique and to facilitate improved neuromuscular control, strength, range of motion, and functional ability resulting in improved performance and form   HOME EXERCISE PROGRAM Access Code: [email or web address] URL: [email or web address] Date: 06/12/2020 Prepared by: Norton BlizzardSara Sayana Salley  Exercises Seated Correct Posture Prone Press Up - 6 x daily - 10-15 reps - 1 second hold Standing Lumbar Extension - 4 x daily - 10-15 reps - 1 sets - 1 second hold Standing Lumbar Extension with Counter - 4 x daily - 10-15 reps - 1 sets - 1 second hold Supine Transversus Abdominis Bracing with Leg Extension - 1 x daily - 1-2 sets - 20 reps - 2-5 seconds hold Bridge - 1 x daily - 1 sets - 20 reps - 2-5 seconds hold Bird Dog - 1 x daily - 1 sets - 20 reps Seated Thoracic Lumbar Extension - 2 x daily - 1-2 sets - 10-15 reps - 2 seconds hold Row with band/cable - 3-7 x weekly - 3 sets  - 10-20 reps  [email or web address] JN [id number] Home Exercise Program [[email or web address]]  Thoracic Extension  -  Repeat 10 Times, Hold 3 Seconds, Complete 1 Set, Perform 8 Times a Day     PT Education - 06/19/20 1826    Education Details Exercise purpose/form. Self management techniques.    Person(s) Educated Patient    Methods Explanation    Comprehension Verbalized understanding;Returned demonstration;Verbal cues required;Tactile cues required;Need further instruction            PT Short Term Goals - 06/12/20 1933      PT SHORT TERM GOAL #1   Title Be independent with initial home exercise program for self-management of symptoms.    Baseline Initial HEP provided at IE (05/22/2020);    Time 2    Period Weeks    Status Achieved    Target Date 06/05/20             PT Long Term Goals - 05/22/20 2022      PT LONG TERM GOAL #1   Title Be independent with a long-term home exercise program for  self-management of symptoms.    Baseline Initial HEP provided at IE (05/22/2020);    Time 12    Period Weeks    Status New   TARGET DATE FOR ALL LONG TERM GOALS: 08/14/2020     PT LONG TERM GOAL #2   Title Demonstrate improved FOTO score to equal or greater than 63 by visit #12 to demonstrate improvement in overall condition and self-reported functional ability    Baseline 49 (05/22/2020);    Time 12    Period Weeks    Status New      PT LONG TERM GOAL #3   Title Have full lumbar AROM with no compensations or increase in pain in all planes except intermittent end range discomfort to allow patient to complete valued activities with less difficulty.    Baseline limited and painful - see objective exam (05/22/2020);    Time 12    Period Weeks    Status New      PT LONG TERM GOAL #4   Title Reduce pain with functional activities to equal or less than 1/10 to allow patient to complete usual activities including ADLs, IADLs, and educate her children with less difficulty.    Baseline up to 10/10 pain  (05/22/2020);    Time 12    Period Weeks    Status New      PT LONG TERM GOAL #5   Title Complete community, work and/or recreational activities without limitation due to current condition.    Baseline Functional Limitations: difficulty with usual activities including cleaning dishes, using bathroom, homeschooling and caring for children, bending, lifting, twisting, sleeping,driving/travleing, dancing, walking, etc (05/22/2020);    Time 12    Period Weeks    Status New                 Plan - 06/19/20 1913    Clinical Impression Statement Patient tolerated treatment well overall and reported feeling less stiff by end of session. Seemed to have global increase in tenderness to palpation but was worse in lower back, right thoracic region (where she usually has "knot"), and right glute. Reported some relief at thoracic region following manual. Also quite tender to needling but no worse after needles removed. Appears to be improving overall but continues to have episodes of sciatic pain. Would benefit from more strengthening/functional strengthening as tolerated. Patient would benefit from continued management of limiting condition by skilled physical therapist to address remaining impairments and functional limitations to work towards stated goals and return to PLOF or maximal functional independence.    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    Examination-Activity Limitations Hygiene/Grooming;Squat;Bed Mobility;Lift;Stairs;Bend;Locomotion Level;Stand;Caring for Others;Toileting;Carry;Sit;Sleep;Dressing;Transfers    Examination-Participation Restrictions Laundry;School;Cleaning;Community Activity;Meal Prep;Shop;Driving;Occupation;Yard Work;Interpersonal Relationship   difficulty with usual activities  including cleaning dishes, using bathroom, homeschooling and caring for children, bending, lifting, twisting, sleeping,driving/travleing, dancing, walking, etc.   Stability/Clinical Decision Making Evolving/Moderate complexity    Rehab Potential Good    PT Frequency 2x / week    PT Duration 12 weeks    PT Treatment/Interventions ADLs/Self Care Home Management;Cryotherapy;Traction;Moist Heat;Electrical Stimulation;DME Instruction;Therapeutic activities;Therapeutic exercise;Neuromuscular re-education;Manual techniques;Patient/family education;Passive range of motion;Dry needling;Joint Manipulations;Spinal Manipulations    PT Next Visit Plan specific exercise, core strengthening, recovery of function, consider dry needling,    PT Home Exercise Plan Medbridge Access Code: [email or web address]    Consulted and Agree with Plan of  Care Patient           Patient will benefit from skilled therapeutic intervention in order to improve the following deficits and impairments:  Pain,Decreased mobility,Increased muscle spasms,Decreased activity tolerance,Decreased endurance,Decreased range of motion,Decreased strength,Hypomobility,Impaired perceived functional ability,Difficulty walking,Impaired flexibility  Visit Diagnosis: Chronic right-sided low back pain with right-sided sciatica  Difficulty in walking, not elsewhere classified     Problem List Patient Active Problem List   Diagnosis Date Noted  . Labor and delivery, indication for care 09/03/2017  . Pelvic cramping 04/16/2016  . First trimester bleeding 04/16/2016  . Vulvar itching 04/16/2016  . Vaginal bleeding 04/16/2016  . H/O female dyspareunia 02/06/2016  . Family history of endometriosis 02/06/2016  . History of dysmenorrhea 02/06/2016  . Overweight (BMI 25.0-29.9) 02/06/2016  . GBS carrier 02/06/2016    Luretha Murphy. Ilsa Iha, PT, DPT 06/19/20, 7:14 PM  [facility] PHYSICAL AND SPORTS MEDICINE [address] [place], [address] Phone: [phone]   Fax:  [phone]  Name: Sheila Baker MRN: [id number] Date of Birth: 29-Aug-1980

## 2020-06-24 ENCOUNTER — Encounter: Payer: 59 | Admitting: Physical Therapy

## 2020-06-26 ENCOUNTER — Encounter: Payer: Self-pay | Admitting: Physical Therapy

## 2020-06-26 ENCOUNTER — Other Ambulatory Visit: Payer: Self-pay

## 2020-06-26 ENCOUNTER — Ambulatory Visit: Payer: 59 | Admitting: Physical Therapy

## 2020-06-26 DIAGNOSIS — M5441 Lumbago with sciatica, right side: Secondary | ICD-10-CM | POA: Diagnosis not present

## 2020-06-26 DIAGNOSIS — G8929 Other chronic pain: Secondary | ICD-10-CM

## 2020-06-26 DIAGNOSIS — R262 Difficulty in walking, not elsewhere classified: Secondary | ICD-10-CM

## 2020-06-26 NOTE — Therapy (Signed)
[facility] MEDICINE [address] [place], [address] Phone: [phone]   Fax:  [phone]  Physical Therapy Treatment  Patient Details  Name: Sheila Baker MRN: [id number] Date of Birth: 1981/01/31 Referring Provider (PT): Cristopher Peru, MD (neurology)   Encounter Date: 06/26/2020   PT End of Session - 06/26/20 1938    Visit Number 6    Number of Visits 24    Date for PT Re-Evaluation 08/14/20    Authorization Type [employer] reporting period from 05/22/2020    Authorization Time Period 60 visits remain OT/PT/Chiro (15 used when insurance verified prior to IE) No Pre Cert [facility] [id number]    Authorization - Visit Number 6    Authorization - Number of Visits 45    Progress Note Due on Visit 10    PT Start Time 1820    PT Stop Time 1900    PT Time Calculation (min) 40 min    Activity Tolerance Patient tolerated treatment well    Behavior During Therapy [facility] for tasks assessed/performed           Past Medical History:  Diagnosis Date  . Acid reflux   . Environmental allergies     Past Surgical History:  Procedure Laterality Date  . SEPTOPLASTY    . WRIST SURGERY      There were no vitals filed for this visit.   Subjective Assessment - 06/26/20 1823    Subjective Patient reports she has pain in her right upper back to the lower back upon arrival, rated 4/10. She is trying not to take as much ibuprofen. Right leg hurt on Sunday after lots of lifting and cleaning over the weekend. She reports she felt okay following her last treatment session. She did get a pretty big bruise from the dry needling. She cannot tell if its helping her. she is trying to fit in her HEP and they don't hurt like they used to. Thinks they help.    Pertinent History Patient is a 40 y.o. female who presents to outpatient physical therapy with a referral for medical diagnosis chronic low back pain. This patient's chief complaints consist of  exacerbation of chronic right sided low back pain with radiation down the posterior R LE to the calf leading to the following functional deficits: difficulty with usual activities including cleaning dishes, using bathroom, homeschooling and caring for children, bending, lifting, twisting, sleeping,driving/travleing, dancing, walking, etc.  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  Patient denies hx of cancer, stroke, seizures, lung problem, major cardiac events, diabetes, unexplained weight loss, changes in bowel or bladder problems, new onset stumbling or dropping things, Back surgery.    Limitations Sitting;Reading;Lifting;Standing;Walking;House hold activities;Other (comment)   difficulty with usual activities including cleaning dishes, using bathroom, homeschooling and caring for children, bending, lifting, twisting, sleeping,driving/travleing, dancing, walking, etc.   Diagnostic tests Lumbar MRI 04/12/2020: "IMPRESSION:  1. Small right central disc protrusion at L5-S1 abutting the  traversing right S1 nerve root.  2. No spinal canal or neural foraminal stenosis at any level."    Currently in Pain? Yes    Pain Score 4     Pain Onset More than a month ago           TREATMENT:  Therapeutic exercise:to centralize symptoms and improve ROM, strength, muscular endurance, and activity tolerance required for successful completion of functional activities. -  MDT lumbar extension with hands on yoga blocks with lock and sag in (prone) lying, 1x10  - MDT lumbar extension with hands on yoga blocks with clinician overpressure x 10 (following manual, difficult).  - hooklying abdominal brace with alternating LE extension 1x4 each side (discontinued due to being too easy) - hooklying reverse curl, 5x10second holds (challenging, not painful) - hooklying single leg bridge, 2x10 each side (challenging, not painful) -  quadruped bird dog with cone over sacrum, 2x10 each side.  - standing glute pull through with 20# cable, 3x10 - standing glute pull through with blue theraband and black theratube, a few reps of each to demonstrate how to perform at home and select correctly firm resistance.  - Education on HEP including handout   Manual therapy:to reduce pain and tissue tension, improve range of motion, neuromodulation, in order to promote improved ability to complete functional activities. - prone STM to R glute region (tender) - prone CPA along lumbar and lower thoracic spine grade III-IV  Pt required multimodal cuing for proper technique and to facilitate improved neuromuscular control, strength, range of motion, and functional ability resulting in improved performance and form   HOME EXERCISE PROGRAM Access Code: [email or web address] URL: [email or web address] Date: 06/26/2020 Prepared by: Norton Blizzard  Exercises Seated Correct Posture Prone Press Up - 6 x daily - 10-15 reps - 1 second hold Standing Lumbar Extension - 4 x daily - 10-15 reps - 1 sets - 1 second hold Standing Lumbar Extension with Counter - 4 x daily - 10-15 reps - 1 sets - 1 second hold Seated Thoracic Lumbar Extension - 2 x daily - 1-2 sets - 10-15 reps - 2 seconds hold  Reverse Curl - 3 x weekly - 2 sets - 5 reps - 10 seconds hold Single Leg Bridge - 3 x weekly - 2-3 sets - 10 reps Bird Dog - 3 x weekly - 2 sets - 15-20 reps - 2 seconds hold Row with band/cable - 3 x weekly - 3 sets - 10-20 reps  [email or web address] Home Exercise Program [[id number]]  Banded Pull Through -  Repeat 10 Times, Complete 3 Sets, Perform 3 Times a Week   PT Education - 06/26/20 1826    Education Details Exercise purpose/form. Self management techniques.    Person(s) Educated Patient    Methods Explanation;Demonstration;Tactile cues;Verbal cues    Comprehension Verbalized understanding;Returned demonstration;Verbal cues required;Tactile cues  required;Need further instruction            PT Short Term Goals - 06/12/20 1933      PT SHORT TERM GOAL #1   Title Be independent with initial home exercise program for self-management of symptoms.    Baseline Initial HEP provided at IE (05/22/2020);    Time 2    Period Weeks    Status Achieved    Target Date 06/05/20             PT Long Term Goals - 05/22/20 2022      PT LONG TERM GOAL #1   Title Be independent with a long-term home exercise program for self-management of symptoms.    Baseline Initial HEP provided at IE (05/22/2020);    Time 12    Period Weeks    Status New   TARGET DATE FOR ALL LONG TERM GOALS: 08/14/2020     PT LONG TERM GOAL #2   Title Demonstrate improved FOTO score to equal or greater than 63 by visit #12 to demonstrate improvement in overall condition and self-reported functional  ability    Baseline 49 (05/22/2020);    Time 12    Period Weeks    Status New      PT LONG TERM GOAL #3   Title Have full lumbar AROM with no compensations or increase in pain in all planes except intermittent end range discomfort to allow patient to complete valued activities with less difficulty.    Baseline limited and painful - see objective exam (05/22/2020);    Time 12    Period Weeks    Status New      PT LONG TERM GOAL #4   Title Reduce pain with functional activities to equal or less than 1/10 to allow patient to complete usual activities including ADLs, IADLs, and educate her children with less difficulty.    Baseline up to 10/10 pain (05/22/2020);    Time 12    Period Weeks    Status New      PT LONG TERM GOAL #5   Title Complete community, work and/or recreational activities without limitation due to current condition.    Baseline Functional Limitations: difficulty with usual activities including cleaning dishes, using bathroom, homeschooling and caring for children, bending, lifting, twisting, sleeping,driving/travleing, dancing, walking, etc (05/22/2020);     Time 12    Period Weeks    Status New                 Plan - 06/26/20 1937    Clinical Impression Statement Patient tolerated treatment well and was able to complete all exercises without lasting increase in pain. Able to progress to glute pull through. Session focused on strengthening exercises and updating HEP to be more appropriate for long term strength/activity tolerance. 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    Examination-Activity Limitations Hygiene/Grooming;Squat;Bed Mobility;Lift;Stairs;Bend;Locomotion Level;Stand;Caring for Others;Toileting;Carry;Sit;Sleep;Dressing;Transfers    Examination-Participation Restrictions Laundry;School;Cleaning;Community Activity;Meal Prep;Shop;Driving;Occupation;Yard Work;Interpersonal Relationship   difficulty with usual activities including cleaning dishes, using bathroom, homeschooling and caring for children, bending, lifting, twisting, sleeping,driving/travleing, dancing, walking, etc.   Stability/Clinical Decision Making Evolving/Moderate complexity    Rehab Potential Good    PT Frequency 2x / week    PT Duration 12 weeks    PT Treatment/Interventions ADLs/Self Care Home Management;Cryotherapy;Traction;Moist Heat;Electrical Stimulation;DME Instruction;Therapeutic activities;Therapeutic exercise;Neuromuscular re-education;Manual techniques;Patient/family education;Passive range of motion;Dry needling;Joint Manipulations;Spinal Manipulations    PT Next Visit Plan specific exercise, core strengthening, recovery  of function, consider dry needling,    PT Home Exercise Plan Medbridge Access Code: [email or web address]    Consulted and Agree with Plan of Care Patient           Patient will benefit from skilled therapeutic intervention in order to improve the following deficits and impairments:  Pain,Decreased mobility,Increased muscle spasms,Decreased activity tolerance,Decreased endurance,Decreased range of motion,Decreased strength,Hypomobility,Impaired perceived functional ability,Difficulty walking,Impaired flexibility  Visit Diagnosis: Chronic right-sided low back pain with right-sided sciatica  Difficulty in walking, not elsewhere classified     Problem List Patient Active Problem List   Diagnosis Date Noted  . Labor and delivery, indication for care 09/03/2017  . Pelvic cramping 04/16/2016  . First trimester bleeding 04/16/2016  . Vulvar itching 04/16/2016  . Vaginal bleeding 04/16/2016  . H/O female  dyspareunia 02/06/2016  . Family history of endometriosis 02/06/2016  . History of dysmenorrhea 02/06/2016  . Overweight (BMI 25.0-29.9) 02/06/2016  . GBS carrier 02/06/2016    Luretha Murphy. Ilsa Iha, PT, DPT 06/26/20, 7:39 PM  [facility] PHYSICAL AND SPORTS MEDICINE [address], [place], [address] Phone: [phone]   Fax:  [phone]  Name: Sheila Baker MRN: [id number] Date of Birth: 05-Jan-1981

## 2020-07-01 ENCOUNTER — Encounter: Payer: 59 | Admitting: Physical Therapy

## 2020-07-03 ENCOUNTER — Other Ambulatory Visit: Payer: Self-pay

## 2020-07-03 ENCOUNTER — Encounter: Payer: Self-pay | Admitting: Physical Therapy

## 2020-07-03 ENCOUNTER — Ambulatory Visit: Payer: 59 | Admitting: Physical Therapy

## 2020-07-03 DIAGNOSIS — M5441 Lumbago with sciatica, right side: Secondary | ICD-10-CM | POA: Diagnosis not present

## 2020-07-03 DIAGNOSIS — G8929 Other chronic pain: Secondary | ICD-10-CM

## 2020-07-03 DIAGNOSIS — R262 Difficulty in walking, not elsewhere classified: Secondary | ICD-10-CM

## 2020-07-03 NOTE — Therapy (Signed)
[facility] MEDICINE [address] [place], [place], [address] Phone: [phone]   Fax:  [phone]  Physical Therapy Treatment  Patient Details  Name: Sheila Baker MRN: [id number] Date of Birth: 02/12/1981 Referring Provider (PT): Cristopher PeruHemang Shah, MD (neurology)   Encounter Date: 07/03/2020   PT End of Session - 07/03/20 1828    Visit Number 7    Number of Visits 24    Date for PT Re-Evaluation 08/14/20    Authorization Type [employer] reporting period from 05/22/2020    Authorization Time Period 60 visits remain OT/PT/Chiro (15 used when insurance verified prior to IE) No Pre Cert Chestine SporeReq  Auth [id number]    Authorization - Visit Number 7    Authorization - Number of Visits 45    Progress Note Due on Visit 10    PT Start Time 1823    PT Stop Time 1903    PT Time Calculation (min) 40 min    Activity Tolerance Patient tolerated treatment well    Behavior [facility] for tasks assessed/performed           Past Medical History:  Diagnosis Date  . Acid reflux   . Environmental allergies     Past Surgical History:  Procedure Laterality Date  . SEPTOPLASTY    . WRIST SURGERY      There were no vitals filed for this visit.   Subjective Assessment - 07/03/20 1826    Subjective Patient reports she has 3/10 pain in the middle lower back. She also has pain in her L upper trap region. no leg pain. Cannot remember when she last felt the pain in her left leg. Continues to have days where she has pain but overall feels better. 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 Did her HEP yesterday. Woke up with left sided neck pain. Would like to complete dry needling today.    Pertinent History Patient is a 40 y.o. female who presents to outpatient physical therapy with a referral for medical diagnosis chronic low back pain. This patient's chief complaints consist of exacerbation of chronic right sided low back pain with radiation down the  posterior R LE to the calf leading to the following functional deficits: difficulty with usual activities including cleaning dishes, using bathroom, homeschooling and caring for children, bending, lifting, twisting, sleeping,driving/travleing, dancing, walking, etc.  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  Patient denies hx of cancer, stroke, seizures, lung problem, major cardiac events, diabetes, unexplained weight loss, changes in bowel or bladder problems, new onset stumbling or dropping things, Back surgery.    Limitations Sitting;Reading;Lifting;Standing;Walking;House hold activities;Other (comment)   difficulty with usual activities including cleaning dishes, using bathroom, homeschooling and caring for children, bending, lifting, twisting, sleeping,driving/travleing, dancing, walking, etc.   Diagnostic tests Lumbar MRI 04/12/2020: "IMPRESSION:  1. Small right central disc protrusion at L5-S1 abutting the  traversing right S1 nerve root.  2. No spinal canal or neural foraminal stenosis at any level."    Currently in Pain? Yes    Pain Score 3     Pain Location Back    Pain Orientation Right    Pain Onset More than a month ago            TREATMENT:  Therapeutic exercise:to centralize symptoms and improve ROM, strength, muscular endurance, and activity tolerance required for successful completion of functional activities. - MDT lumbar  extension with hands on yoga blockswith lock and sagin (prone) lying,2x10 (before and after manual) - hooklying single leg bridge, 1x10 each side (challenging, not painful) - standing glute pull through with 20# cable, 1x10 - standing lumbar rotation with blue foam roll behind back 1x20 - standing golf swing practice, starting with slow velocity to increase to swing velocity (no lasting pain).   Manual therapy:to reduce pain and tissue tension, improve range  of motion, neuromodulation, in order to promote improved ability to complete functional activities. - prone CPA along lumbar and lower thoracic spine grade III-IV - STM to lower lumbar paraspinals (tender)  Modality: (unbilled) Dry needling performed to low back to decrease pain and spasms along patient's lumbar region with patient in prone utilizing (1) dry needle(s) .30 mm x 75 mm with (4) sticks at right and left lumbar multifidi at approximately L4, and at right and left lumbar iliocostalis at approximately L4 level . Patient educated about the risks and benefits from therapy and verbally consents to treatment.  Dry needling performed by Luretha Murphy. Ilsa Iha PT, DPT who is certified in this technique.  Pt required multimodal cuing for proper technique and to facilitate improved neuromuscular control, strength, range of motion, and functional ability resulting in improved performance and form   HOME EXERCISE PROGRAM Access Code: [email or web address] URL: [email or web address] Date: 06/26/2020 Prepared by: Norton Blizzard  Exercises Seated Correct Posture Prone Press Up - 6 x daily - 10-15 reps - 1 second hold Standing Lumbar Extension - 4 x daily - 10-15 reps - 1 sets - 1 second hold Standing Lumbar Extension with Counter - 4 x daily - 10-15 reps - 1 sets - 1 second hold Seated Thoracic Lumbar Extension - 2 x daily - 1-2 sets - 10-15 reps - 2 seconds hold  Reverse Curl - 3 x weekly - 2 sets - 5 reps - 10 seconds hold Single Leg Bridge - 3 x weekly - 2-3 sets - 10 reps Bird Dog - 3 x weekly - 2 sets - 15-20 reps - 2 seconds hold Row with band/cable - 3 x weekly - 3 sets - 10-20 reps  [email or web address] Home Exercise Program [[id number]]  Banded Pull Through -  Repeat 10 Times, Complete 3 Sets, Perform 3 Times a Week    PT Education - 07/03/20 1828    Education Details Exercise purpose/form. Self management techniques.    Person(s) Educated Patient    Methods  Explanation;Demonstration;Tactile cues;Verbal cues    Comprehension Verbalized understanding;Returned demonstration;Verbal cues required;Tactile cues required;Need further instruction            PT Short Term Goals - 06/12/20 1933      PT SHORT TERM GOAL #1   Title Be independent with initial home exercise program for self-management of symptoms.    Baseline Initial HEP provided at IE (05/22/2020);    Time 2    Period Weeks    Status Achieved    Target Date 06/05/20             PT Long Term Goals - 05/22/20 2022      PT LONG TERM GOAL #1   Title Be independent with a long-term home exercise program for self-management of symptoms.    Baseline Initial HEP provided at IE (05/22/2020);    Time 12    Period Weeks    Status New   TARGET DATE FOR ALL LONG TERM GOALS: 08/14/2020     PT LONG TERM GOAL #2   Title Demonstrate improved  FOTO score to equal or greater than 63 by visit #12 to demonstrate improvement in overall condition and self-reported functional ability    Baseline 49 (05/22/2020);    Time 12    Period Weeks    Status New      PT LONG TERM GOAL #3   Title Have full lumbar AROM with no compensations or increase in pain in all planes except intermittent end range discomfort to allow patient to complete valued activities with less difficulty.    Baseline limited and painful - see objective exam (05/22/2020);    Time 12    Period Weeks    Status New      PT LONG TERM GOAL #4   Title Reduce pain with functional activities to equal or less than 1/10 to allow patient to complete usual activities including ADLs, IADLs, and educate her children with less difficulty.    Baseline up to 10/10 pain (05/22/2020);    Time 12    Period Weeks    Status New      PT LONG TERM GOAL #5   Title Complete community, work and/or recreational activities without limitation due to current condition.    Baseline Functional Limitations: difficulty with usual activities including cleaning  dishes, using bathroom, homeschooling and caring for children, bending, lifting, twisting, sleeping,driving/travleing, dancing, walking, etc (05/22/2020);    Time 12    Period Weeks    Status New                 Plan - 07/03/20 2019    Clinical Impression Statement Patient tolerated treatment well overall with some discomfort at low back with dry needling. Continues to report extension exercises feel good. Was able to do her HEP for strength one time since last session and discussed attempting it twice this time. Neck pain was not reproduced when completing exercises in clinic today, reducing likelihood that her HEP increased neck pain. Patient would benefit from continued management of limiting condition by skilled physical therapist to address remaining impairments and functional limitations to work towards stated goals and return to PLOF or maximal functional independence.    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    Examination-Activity Limitations Hygiene/Grooming;Squat;Bed Mobility;Lift;Stairs;Bend;Locomotion Level;Stand;Caring for Others;Toileting;Carry;Sit;Sleep;Dressing;Transfers    Examination-Participation Restrictions Laundry;School;Cleaning;Community Activity;Meal Prep;Shop;Driving;Occupation;Yard Work;Interpersonal Relationship   difficulty with usual activities including cleaning dishes, using bathroom, homeschooling and caring for children, bending, lifting, twisting, sleeping,driving/travleing, dancing, walking, etc.   Stability/Clinical Decision Making Evolving/Moderate complexity    Rehab Potential Good    PT Frequency 2x / week    PT Duration 12 weeks    PT Treatment/Interventions ADLs/Self Care Home Management;Cryotherapy;Traction;Moist Heat;Electrical Stimulation;DME  Instruction;Therapeutic activities;Therapeutic exercise;Neuromuscular re-education;Manual techniques;Patient/family education;Passive range of motion;Dry needling;Joint Manipulations;Spinal Manipulations    PT Next Visit Plan specific exercise, core strengthening, recovery of function, consider dry needling,    PT Home Exercise Plan Medbridge Access Code: [email or web address]    Consulted and Agree with Plan of Care Patient           Patient will benefit from skilled therapeutic intervention in order to improve the following deficits and impairments:  Pain,Decreased mobility,Increased muscle spasms,Decreased activity tolerance,Decreased endurance,Decreased range of motion,Decreased strength,Hypomobility,Impaired perceived functional ability,Difficulty walking,Impaired flexibility  Visit Diagnosis: Chronic right-sided low back pain with right-sided sciatica  Difficulty in walking, not elsewhere classified     Problem List Patient Active Problem  List   Diagnosis Date Noted  . Labor and delivery, indication for care 09/03/2017  . Pelvic cramping 04/16/2016  . First trimester bleeding 04/16/2016  . Vulvar itching 04/16/2016  . Vaginal bleeding 04/16/2016  . H/O female dyspareunia 02/06/2016  . Family history of endometriosis 02/06/2016  . History of dysmenorrhea 02/06/2016  . Overweight (BMI 25.0-29.9) 02/06/2016  . GBS carrier 02/06/2016    Cira Rue 07/03/2020, 8:21 PM  [facility] PH[facility]TS MEDICINE [address] [place], [address] Phone: [phone]   Fax:  [phone]  Name: [place] Leather MRN: [id number] Date of Birth: [birth year]

## 2020-07-08 ENCOUNTER — Encounter: Payer: 59 | Admitting: Physical Therapy

## 2020-07-10 ENCOUNTER — Ambulatory Visit: Payer: 59 | Attending: Neurology | Admitting: Physical Therapy

## 2020-07-10 ENCOUNTER — Encounter: Payer: Self-pay | Admitting: Physical Therapy

## 2020-07-10 ENCOUNTER — Other Ambulatory Visit: Payer: Self-pay

## 2020-07-10 DIAGNOSIS — R262 Difficulty in walking, not elsewhere classified: Secondary | ICD-10-CM | POA: Insufficient documentation

## 2020-07-10 DIAGNOSIS — M5441 Lumbago with sciatica, right side: Secondary | ICD-10-CM | POA: Insufficient documentation

## 2020-07-10 DIAGNOSIS — G8929 Other chronic pain: Secondary | ICD-10-CM | POA: Insufficient documentation

## 2020-07-10 NOTE — Therapy (Signed)
[facility] [facility] [address] [place], [address] Phone: [phone]   Fax:  [phone]  Physical Therapy Treatment  Patient Details  Name: Sheila Baker MRN: [id number] Date of Birth: 1980/11/03 Referring Provider (PT): Cristopher Peru, MD (neurology)   Encounter Date: 07/10/2020   PT End of Session - 07/10/20 1835    Visit Number 8    Number of Visits 24    Date for PT Re-Evaluation 08/14/20    Authorization Type [employer] reporting period from 05/22/2020    Authorization Time Period 60 visits remain OT/PT/Chiro (15 used when insurance verified prior to IE) No Pre Cert [facility] [id number]    Authorization - Visit Number 8    Authorization - Number of Visits 45    Progress Note Due on Visit 10    PT Start Time 1820    PT Stop Time 1900    PT Time Calculation (min) 40 min    Activity Tolerance Patient tolerated treatment well    Behavior During Therapy [facility] for tasks assessed/performed           Past Medical History:  Diagnosis Date  . Acid reflux   . Environmental allergies     Past Surgical History:  Procedure Laterality Date  . SEPTOPLASTY    . WRIST SURGERY      There were no vitals filed for this visit.   Subjective Assessment - 07/10/20 1822    Subjective Patient reports her low back is feeling okay but she continues to have more pain in her right shoulder blade region. It has been numb there all day. States she was not able to do all of her strengthinging exercises since her daughter has been sick this week wanting to be held. States she has been sitting on the floor painting and has noticed it hurts a little in her back. Wonders if sititng on a cushion would help.    Pertinent History Patient is a 40 y.o. female who presents to outpatient physical therapy with a referral for medical diagnosis chronic low back pain. This patient's chief complaints consist of exacerbation of chronic right sided low back pain  with radiation down the posterior R LE to the calf leading to the following functional deficits: difficulty with usual activities including cleaning dishes, using bathroom, homeschooling and caring for children, bending, lifting, twisting, sleeping,driving/travleing, dancing, walking, etc.  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  Patient denies hx of cancer, stroke, seizures, lung problem, major cardiac events, diabetes, unexplained weight loss, changes in bowel or bladder problems, new onset stumbling or dropping things, Back surgery.    Limitations Sitting;Reading;Lifting;Standing;Walking;House hold activities;Other (comment)   difficulty with usual activities including cleaning dishes, using bathroom, homeschooling and caring for children, bending, lifting, twisting, sleeping,driving/travleing, dancing, walking, etc.   Diagnostic tests Lumbar MRI 04/12/2020: "IMPRESSION:  1. Small right central disc protrusion at L5-S1 abutting the  traversing right S1 nerve root.  2. No spinal canal or neural foraminal stenosis at any level."    Currently in Pain? Yes    Pain Score 3     Pain Location Thoracic    Pain Orientation Right    Pain Onset More than a month ago           TREATMENT:  Therapeutic exercise:to centralize symptoms and improve ROM, strength, muscular endurance, and activity tolerance required for successful completion of  functional activities. - seated thoracic extension over back of chair, 2x10 - MDT lumbar extensionwith hands on yoga blockswith lock and sagin (prone) lying,2x10 - hooklying single leg bridge, 2x10 each side (challenging, not painful) - hooklying reverse curl, 5x10 seconds, 2 sets - standing glute pull through with 20# cable, 3x10 f(stopped to do a set of prone press up following 2nd set).  - seated lat pull down, 2x10, 25#  Manual therapy: to reduce pain and tissue  tension, improve range of motion, neuromodulation, in order to promote improved ability to complete functional activities. PRONE POSITION - CPA grade III-IV with KE wedge from mid thoracic spine to lumbar spine, focusing on more repetitions at the lowest lumbar segments.   Pt required multimodal cuing for proper technique and to facilitate improved neuromuscular control, strength, range of motion, and functional ability resulting in improved performance and form   HOME EXERCISE PROGRAM Access Code: [email or web address] URL: [email or web address] Date: 06/26/2020 Prepared by: Norton Blizzard  Exercises Seated Correct Posture Prone Press Up - 6 x daily - 10-15 reps - 1 second hold Standing Lumbar Extension - 4 x daily - 10-15 reps - 1 sets - 1 second hold Standing Lumbar Extension with Counter - 4 x daily - 10-15 reps - 1 sets - 1 second hold Seated Thoracic Lumbar Extension - 2 x daily - 1-2 sets - 10-15 reps - 2 seconds hold  Reverse Curl - 3 x weekly - 2 sets - 5 reps - 10 seconds hold Single Leg Bridge - 3 x weekly - 2-3 sets - 10 reps Bird Dog - 3 x weekly - 2 sets - 15-20 reps - 2 seconds hold Row with band/cable - 3 x weekly - 3 sets - 10-20 reps  [email or web address] Exercise Program [[id number]]  Banded Pull Through - Repeat 10 Times, Complete 3 Sets, Perform 3 Times a Week    PT Education - 07/10/20 1835    Education Details Exercise purpose/form. Self management techniques.    Person(s) Educated Patient    Methods Explanation;Demonstration;Tactile cues;Verbal cues    Comprehension Verbalized understanding;Returned demonstration;Verbal cues required;Tactile cues required;Need further instruction            PT Short Term Goals - 06/12/20 1933      PT SHORT TERM GOAL #1   Title Be independent with initial home exercise program for self-management of symptoms.    Baseline Initial HEP provided at IE (05/22/2020);    Time 2    Period Weeks    Status Achieved    Target  Date 06/05/20             PT Long Term Goals - 05/22/20 2022      PT LONG TERM GOAL #1   Title Be independent with a long-term home exercise program for self-management of symptoms.    Baseline Initial HEP provided at IE (05/22/2020);    Time 12    Period Weeks    Status New   TARGET DATE FOR ALL LONG TERM GOALS: 08/14/2020     PT LONG TERM GOAL #2   Title Demonstrate improved FOTO score to equal or greater than 63 by visit #12 to demonstrate improvement in overall condition and self-reported functional ability    Baseline 49 (05/22/2020);    Time 12    Period Weeks    Status New      PT LONG TERM GOAL #3   Title Have full lumbar AROM with no compensations or increase in pain in all planes except  intermittent end range discomfort to allow patient to complete valued activities with less difficulty.    Baseline limited and painful - see objective exam (05/22/2020);    Time 12    Period Weeks    Status New      PT LONG TERM GOAL #4   Title Reduce pain with functional activities to equal or less than 1/10 to allow patient to complete usual activities including ADLs, IADLs, and educate her children with less difficulty.    Baseline up to 10/10 pain (05/22/2020);    Time 12    Period Weeks    Status New      PT LONG TERM GOAL #5   Title Complete community, work and/or recreational activities without limitation due to current condition.    Baseline Functional Limitations: difficulty with usual activities including cleaning dishes, using bathroom, homeschooling and caring for children, bending, lifting, twisting, sleeping,driving/travleing, dancing, walking, etc (05/22/2020);    Time 12    Period Weeks    Status New                 Plan - 07/10/20 2014    Clinical Impression Statement Patient tolerated treatment well overall but did report some increasing back discomfort with more sets of glute pull through, which was improved by prone press up. Patient requested manual therapy  stating it does help her pain. More tender than usual to CPA this session. Continued to focus on progressing strengthening and functional activities while not exacerbating pain. Patient would benefit from continued management of limiting condition by skilled physical therapist to address remaining impairments and functional limitations to work towards stated goals and return to PLOF or maximal functional independence.    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    Examination-Activity Limitations Hygiene/Grooming;Squat;Bed Mobility;Lift;Stairs;Bend;Locomotion Level;Stand;Caring for Others;Toileting;Carry;Sit;Sleep;Dressing;Transfers    Examination-Participation Restrictions Laundry;School;Cleaning;Community Activity;Meal Prep;Shop;Driving;Occupation;Yard Work;Interpersonal Relationship   difficulty with usual activities including cleaning dishes, using bathroom, homeschooling and caring for children, bending, lifting, twisting, sleeping,driving/travleing, dancing, walking, etc.   Stability/Clinical Decision Making Evolving/Moderate complexity    Rehab Potential Good    PT Frequency 2x / week    PT Duration 12 weeks    PT Treatment/Interventions ADLs/Self Care Home Management;Cryotherapy;Traction;Moist Heat;Electrical Stimulation;DME Instruction;Therapeutic activities;Therapeutic exercise;Neuromuscular re-education;Manual techniques;Patient/family education;Passive range of motion;Dry needling;Joint Manipulations;Spinal Manipulations    PT Next Visit Plan specific exercise, core strengthening, recovery of function, consider dry needling,    PT Home Exercise Plan Medbridge Access Code: [email or web address]    Consulted and Agree with Plan of Care Patient           Patient will benefit from skilled therapeutic intervention  in order to improve the following deficits and impairments:  Pain,Decreased mobility,Increased muscle spasms,Decreased activity tolerance,Decreased endurance,Decreased range of motion,Decreased strength,Hypomobility,Impaired perceived functional ability,Difficulty walking,Impaired flexibility  Visit Diagnosis: Chronic right-sided low back pain with right-sided sciatica  Difficulty in walking, not elsewhere classified     Problem List Patient Active Problem List   Diagnosis Date Noted  . Labor and delivery, indication for care 09/03/2017  . Pelvic cramping 04/16/2016  . First trimester bleeding 04/16/2016  . Vulvar itching 04/16/2016  . Vaginal bleeding 04/16/2016  . H/O female dyspareunia 02/06/2016  . Family history of endometriosis 02/06/2016  . History of dysmenorrhea 02/06/2016  . Overweight (BMI 25.0-29.9) 02/06/2016  . GBS carrier 02/06/2016    Luretha MurphySara R. Ilsa IhaSnyder, PT,  DPT 07/10/20, 8:15 PM  [place] [facility] [address] [place], [address] Phone: [phone]   Fax:  [phone]  Name: Sheila Heggie MRN: [id number] Date of Birth: [birth year]

## 2020-07-17 ENCOUNTER — Encounter: Payer: Self-pay | Admitting: Physical Therapy

## 2020-07-17 ENCOUNTER — Ambulatory Visit: Payer: 59 | Admitting: Physical Therapy

## 2020-07-17 ENCOUNTER — Other Ambulatory Visit: Payer: Self-pay

## 2020-07-17 DIAGNOSIS — R262 Difficulty in walking, not elsewhere classified: Secondary | ICD-10-CM

## 2020-07-17 DIAGNOSIS — M5441 Lumbago with sciatica, right side: Secondary | ICD-10-CM | POA: Diagnosis not present

## 2020-07-17 DIAGNOSIS — G8929 Other chronic pain: Secondary | ICD-10-CM

## 2020-07-17 NOTE — Therapy (Signed)
[place] [facility] [address], [place], [address] Phone: [phone]   Fax:  [phone]  Physical Therapy Treatment  Patient Details  Name: Sheila Baker MRN: [id number] Date of Birth: [birth year] Referring Provider (PT): Cristopher Peru, MD (neurology)   Encounter Date: 07/17/2020   PT End of Session - 07/17/20 1825    Visit Number 9    Number of Visits 24    Date for PT Re-Evaluation 08/14/20    Authorization Type [employer] reporting period from 05/22/2020    Authorization Time Period 60 visits remain OT/PT/Chiro (15 used when insurance verified prior to IE) No Pre Cert [facility] [id number]    Authorization - Visit Number 9    Authorization - Number of Visits 45    Progress Note Due on Visit 10    PT Start Time 1821    PT Stop Time 1859    PT Time Calculation (min) 38 min    Activity Tolerance Patient tolerated treatment well    Behavior During Therapy [facility] for tasks assessed/performed           Past Medical History:  Diagnosis Date  . Acid reflux   . Environmental allergies     Past Surgical History:  Procedure Laterality Date  . SEPTOPLASTY    . WRIST SURGERY      There were no vitals filed for this visit.   Subjective Assessment - 07/17/20 1821    Subjective Patient report she is better and her back pain is about 2/10 on alieve and feels her pain is worse when on her period (which she is). Her family has been sick since last week. She thinks her back is better because she is not in as much pain most of the time. She still has to be careful and not make quick movements to the side. Has only had a little leg pain recently.    Pertinent History Patient is a 40 y.o. female who presents to outpatient physical therapy with a referral for medical diagnosis chronic low back pain. This patient's chief complaints consist of exacerbation of chronic right sided low back pain with radiation down the posterior R LE to the  calf leading to the following functional deficits: difficulty with usual activities including cleaning dishes, using bathroom, homeschooling and caring for children, bending, lifting, twisting, sleeping,driving/travleing, dancing, walking, etc.  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  Patient denies hx of cancer, stroke, seizures, lung problem, major cardiac events, diabetes, unexplained weight loss, changes in bowel or bladder problems, new onset stumbling or dropping things, Back surgery.    Limitations Sitting;Reading;Lifting;Standing;Walking;House hold activities;Other (comment)   difficulty with usual activities including cleaning dishes, using bathroom, homeschooling and caring for children, bending, lifting, twisting, sleeping,driving/travleing, dancing, walking, etc.   Diagnostic tests Lumbar MRI 04/12/2020: "IMPRESSION:  1. Small right central disc protrusion at L5-S1 abutting the  traversing right S1 nerve root.  2. No spinal canal or neural foraminal stenosis at any level."    Currently in Pain? Yes    Pain Score 2     Pain Location Back    Pain Onset More than a month ago           TREATMENT:  Therapeutic exercise:to centralize symptoms and improve ROM, strength, muscular endurance, and activity tolerance required for successful completion of functional activities. - MDT lumbar extensionwith hands on yoga blockswith  lock and sagin (prone) lying,2x10 - standing multifidus press, 2x10 (10#cable/blacktheraband).  -seated good morning with 25# cable, 3x10 - Education on HEP including handout   Manual therapy: to reduce pain and tissue tension, improve range of motion, neuromodulation, in order to promote improved ability to complete functional activities. PRONE POSITION - CPA grade III-IV with KE wedge from mid thoracic spine to lumbar spine, focusing on more repetitions at the lowest  lumbar segments.  - STM to right glute region (mild tenderness)   Pt required multimodal cuing for proper technique and to facilitate improved neuromuscular control, strength, range of motion, and functional ability resulting in improved performance and form   HOME EXERCISE PROGRAM Access Code: [email or web address] URL: [email or web address] Date: 06/26/2020 Prepared by: Norton Blizzard  Exercises Seated Correct Posture Prone Press Up - 6 x daily - 10-15 reps - 1 second hold Standing Lumbar Extension - 4 x daily - 10-15 reps - 1 sets - 1 second hold Standing Lumbar Extension with Counter - 4 x daily - 10-15 reps - 1 sets - 1 second hold Seated Thoracic Lumbar Extension - 2 x daily - 1-2 sets - 10-15 reps - 2 seconds hold  Reverse Curl - 3 x weekly - 2 sets - 5 reps - 10 seconds hold Single Leg Bridge - 3 x weekly - 2-3 sets - 10 reps Bird Dog - 3 x weekly - 2 sets - 15-20 reps - 2 seconds hold Row with band/cable - 3 x weekly - 3 sets - 10-20 reps   [email or web address] Home Exercise Program [[id number]]  Seated Good Mornings ATG -  Repeat 10 Times, Complete 3 Sets, Perform 3 Times a Week    PT Education - 07/17/20 1824    Education Details Exercise purpose/form. Self management techniques.    Person(s) Educated Patient    Methods Explanation;Demonstration;Tactile cues;Verbal cues    Comprehension Verbalized understanding;Returned demonstration;Verbal cues required;Tactile cues required;Need further instruction            PT Short Term Goals - 06/12/20 1933      PT SHORT TERM GOAL #1   Title Be independent with initial home exercise program for self-management of symptoms.    Baseline Initial HEP provided at IE (05/22/2020);    Time 2    Period Weeks    Status Achieved    Target Date 06/05/20             PT Long Term Goals - 05/22/20 2022      PT LONG TERM GOAL #1   Title Be independent with a long-term home exercise program for self-management of symptoms.     Baseline Initial HEP provided at IE (05/22/2020);    Time 12    Period Weeks    Status New   TARGET DATE FOR ALL LONG TERM GOALS: 08/14/2020     PT LONG TERM GOAL #2   Title Demonstrate improved FOTO score to equal or greater than 63 by visit #12 to demonstrate improvement in overall condition and self-reported functional ability    Baseline 49 (05/22/2020);    Time 12    Period Weeks    Status New      PT LONG TERM GOAL #3   Title Have full lumbar AROM with no compensations or increase in pain in all planes except intermittent end range discomfort to allow patient to complete valued activities with less difficulty.    Baseline limited and painful - see objective exam (05/22/2020);    Time 12  Period Weeks    Status New      PT LONG TERM GOAL #4   Title Reduce pain with functional activities to equal or less than 1/10 to allow patient to complete usual activities including ADLs, IADLs, and educate her children with less difficulty.    Baseline up to 10/10 pain (05/22/2020);    Time 12    Period Weeks    Status New      PT LONG TERM GOAL #5   Title Complete community, work and/or recreational activities without limitation due to current condition.    Baseline Functional Limitations: difficulty with usual activities including cleaning dishes, using bathroom, homeschooling and caring for children, bending, lifting, twisting, sleeping,driving/travleing, dancing, walking, etc (05/22/2020);    Time 12    Period Weeks    Status New                 Plan - 07/17/20 2007    Clinical Impression Statement Patient tolerated treatment well overall and felt more comfortable performing seated good morning than glute pull through. Continued manual therapy for pain control and improved motion. Updated HEP with new exercises .Patient would benefit from continued management of limiting condition by skilled physical therapist to address remaining impairments and functional limitations to work  towards stated goals and return to PLOF or maximal functional independence.    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    Examination-Activity Limitations Hygiene/Grooming;Squat;Bed Mobility;Lift;Stairs;Bend;Locomotion Level;Stand;Caring for Others;Toileting;Carry;Sit;Sleep;Dressing;Transfers    Examination-Participation Restrictions Laundry;School;Cleaning;Community Activity;Meal Prep;Shop;Driving;Occupation;Yard Work;Interpersonal Relationship   difficulty with usual activities including cleaning dishes, using bathroom, homeschooling and caring for children, bending, lifting, twisting, sleeping,driving/travleing, dancing, walking, etc.   Stability/Clinical Decision Making Evolving/Moderate complexity    Rehab Potential Good    PT Frequency 2x / week    PT Duration 12 weeks    PT Treatment/Interventions ADLs/Self Care Home Management;Cryotherapy;Traction;Moist Heat;Electrical Stimulation;DME Instruction;Therapeutic activities;Therapeutic exercise;Neuromuscular re-education;Manual techniques;Patient/family education;Passive range of motion;Dry needling;Joint Manipulations;Spinal Manipulations    PT Next Visit Plan specific exercise, core strengthening, recovery of function, consider dry needling,    PT Home Exercise Plan Medbridge Access Code: [email or web address]    Consulted and Agree with Plan of Care Patient           Patient will benefit from skilled therapeutic intervention in order to improve the following deficits and impairments:  Pain,Decreased mobility,Increased muscle spasms,Decreased activity tolerance,Decreased endurance,Decreased range of motion,Decreased strength,Hypomobility,Impaired perceived functional ability,Difficulty walking,Impaired flexibility  Visit Diagnosis: Chronic right-sided low back  pain with right-sided sciatica  Difficulty in walking, not elsewhere classified     Problem List Patient Active Problem List   Diagnosis Date Noted  . Labor and delivery, indication for care 09/03/2017  . Pelvic cramping 04/16/2016  . First trimester bleeding 04/16/2016  . Vulvar itching 04/16/2016  . Vaginal bleeding 04/16/2016  . H/O female dyspareunia 02/06/2016  . Family history of endometriosis 02/06/2016  . History of dysmenorrhea 02/06/2016  . Overweight (BMI 25.0-29.9) 02/06/2016  . GBS carrier 02/06/2016    Luretha Murphy. Ilsa Iha, PT, DPT 07/17/20, 8:08 PM  [place] [facility] SPORTS MEDIC[facility] [address] [place], [address] Phone: [phone]   Fax:  [phone]  Name: Sheila Baker MRN: [id number] Date of Birth: Jul 22, 1980

## 2020-07-24 ENCOUNTER — Ambulatory Visit: Payer: 59 | Admitting: Physical Therapy

## 2020-07-31 ENCOUNTER — Ambulatory Visit: Payer: 59 | Admitting: Physical Therapy

## 2020-07-31 ENCOUNTER — Encounter: Payer: Self-pay | Admitting: Physical Therapy

## 2020-07-31 ENCOUNTER — Other Ambulatory Visit: Payer: Self-pay

## 2020-07-31 DIAGNOSIS — M5441 Lumbago with sciatica, right side: Secondary | ICD-10-CM | POA: Diagnosis not present

## 2020-07-31 DIAGNOSIS — G8929 Other chronic pain: Secondary | ICD-10-CM

## 2020-07-31 DIAGNOSIS — R262 Difficulty in walking, not elsewhere classified: Secondary | ICD-10-CM

## 2020-07-31 NOTE — Therapy (Signed)
[facility] [address], [place], [address] Phone: [phone]   Fax:  [phone]  Physical Therapy Treatment / Discharge Summary Dates of reporting: 05/22/2020 - 07/31/2020  Patient Details  Name: Sheila Baker MRN: [id number] Date of Birth: 18-Aug-1980 Referring Provider (PT): Jennings Books, MD (neurology)   Encounter Date: 07/31/2020   PT End of Session - 07/31/20 1827    Visit Number 10    Number of Visits 24    Date for PT Re-Evaluation 08/14/20    Authorization Type [employer] reporting period from 05/22/2020    Authorization Time Period 60 visits remain OT/PT/Chiro (15 used when insurance verified prior to IE) No Pre Cert Roxanna Mew [id number]    Authorization - Visit Number 10    Authorization - Number of Visits 45    Progress Note Due on Visit 10    PT Start Time 1824    PT Stop Time 1855    PT Time Calculation (min) 31 min    Activity Tolerance Patient tolerated treatment well    Behavior During Therapy [facility] for tasks assessed/performed           Past Medical History:  Diagnosis Date  . Acid reflux   . Environmental allergies     Past Surgical History:  Procedure Laterality Date  . SEPTOPLASTY    . WRIST SURGERY      There were no vitals filed for this visit.   Subjective Assessment - 07/31/20 1824    Subjective Patient reports she has had a rough day with her puppy. She thinks her back is better (definitely since when she first started). She does still have pain intermittantly. Reports current pain 2/10 in the back only. Does not remember the last time it went down her leg. HEP is going okay but she continues to have difficulty trying to find time to do them. Highest level of pain she had over the last 2 weeks was 3/10. States she would like to discharge from PT today but would also like to try dry needling one more time. States she feels she does not need any additional instruction on her HEP.     Pertinent History Patient is a 40 y.o. female who presents to outpatient physical therapy with a referral for medical diagnosis chronic low back pain. This patient's chief complaints consist of exacerbation of chronic right sided low back pain with radiation down the posterior R LE to the calf leading to the following functional deficits: difficulty with usual activities including cleaning dishes, using bathroom, homeschooling and caring for children, bending, lifting, twisting, sleeping,driving/travleing, dancing, walking, etc.  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  Patient denies hx of cancer, stroke, seizures, lung problem, major cardiac events, diabetes, unexplained weight loss, changes in bowel or bladder problems, new onset stumbling or dropping things, Back surgery.    Limitations Sitting;Reading;Lifting;Standing;Walking;House hold activities;Other (comment)   difficulty with usual activities including cleaning dishes, using bathroom, homeschooling and caring for children, bending, lifting, twisting, sleeping,driving/travleing, dancing, walking, etc.   Diagnostic tests Lumbar MRI 04/12/2020: "IMPRESSION:  1. Small right central disc protrusion at L5-S1 abutting the  traversing right S1 nerve root.  2. No spinal canal or neural foraminal stenosis at any level."    Currently in Pain? Yes    Pain Score 2     Pain Location Back    Pain Orientation  Right    Pain Onset More than a month ago    Effect of Pain on Daily Activities Much better from her original limitations. Still has some trouble with a lot of cleaning or bending forward for long periods of time, prolonged sitting.           OBJECTIVE FOTO = 70 (07/31/2020)  Lumbar AROM *Indicates pain  Flexion: = fingers to floor, no pain   Extension: = 100% mild soreness  Rotation: B = 100% no pain  Side Flexion: R = WFL      TREATMENT:  Therapeutic exercise:to centralize symptoms and improve ROM, strength, muscular endurance, and activity tolerance required for successful completion of functional activities. - AROM testing to assess progress (see above) - MDT lumbar extensionwith hands on yoga blockswith lock and sagin (prone) lying,1x10  Manual therapy:to reduce pain and tissue tension, improve range of motion, neuromodulation, in order to promote improved ability to complete functional activities. PRONE POSITION - CPA grade III-IV with KE wedge from mid thoracic spine to lumbar spine, focusing on more repetitions at the lowest lumbar segments. - STM to B lumbar paraspinals (R tender, L tight) and  right glute region (minimal to no tenderness)  Modality: (unbilled) Dry needling performed to right lumbar spine to decrease pain and spasms along patient's back and right glute region with patient in prone utilizing (1) dry needle(s) .70m x 763mwith (1) sticks at right lumbar multifidi at approximately L4-5. Good muscle twitch noted, very tender, muscle grabbed needle so provided additional time for it to relax before removing it. Successfully placed where intended. Patient educated about the risks and benefits from therapy and verbally consents to treatment.  Dry needling performed by SaEverlean AlstromSnGraylon GoodT, DPT who is certified in this technique.  HOME EXERCISE PROGRAM Access Code: [email or web address] URL: [email or web address] Date: 06/26/2020 Prepared by: SaRosita KeaExercises Seated Correct Posture Prone Press Up - 6 x daily - 10-15 reps - 1 second hold Standing Lumbar Extension - 4 x daily - 10-15 reps - 1 sets - 1 second hold Standing Lumbar Extension with Counter - 4 x daily - 10-15 reps - 1 sets - 1 second hold Seated Thoracic Lumbar Extension - 2 x daily - 1-2 sets - 10-15 reps - 2 seconds hold  Reverse Curl - 3 x weekly - 2 sets - 5 reps - 10 seconds hold Single Leg Bridge - 3 x weekly -  2-3 sets - 10 reps Bird Dog - 3 x weekly - 2 sets - 15-20 reps - 2 seconds hold Row with band/cable - 3 x weekly - 3 sets - 10-20 reps   [email or web address] Home Exercise Program [[id number]]  Seated Good Mornings ATG -  Repeat 10 Times, Complete 3 Sets, Perform 3 Times a Week     PT Education - 07/31/20 [id number]  Education Details Exercise purpose/form. Self management techniques. POC, progress, discharge advice    Person(s) Educated Patient    Methods Explanation    Comprehension Verbalized understanding;Returned demonstration            PT Short Term Goals - 06/12/20 1933      PT SHORT TERM GOAL #1   Title Be independent with initial home exercise program for self-management of symptoms.    Baseline Initial HEP provided at IE (05/22/2020);    Time 2    Period Weeks    Status Achieved    Target Date 06/05/20  PT Long Term Goals - 07/31/20 2003      PT LONG TERM GOAL #1   Title Be independent with a long-term home exercise program for self-management of symptoms.    Baseline Initial HEP provided at IE (05/22/2020); provided with appropriate HEP but has some difficulty participating as prescribed due to time management barriers (07/31/2020);    Time 12    Period Weeks    Status Partially Met   TARGET DATE FOR ALL LONG TERM GOALS: 08/14/2020     PT LONG TERM GOAL #2   Title Demonstrate improved FOTO score to equal or greater than 63 by visit #12 to demonstrate improvement in overall condition and self-reported functional ability    Baseline 49 (05/22/2020); 70 at visit #10 (07/31/2020);    Time 12    Period Weeks    Status Achieved      PT LONG TERM GOAL #3   Title Have full lumbar AROM with no compensations or increase in pain in all planes except intermittent end range discomfort to allow patient to complete valued activities with less difficulty.    Baseline limited and painful - see objective exam (05/22/2020); no longer limited with minimal end range discomfort  (07/31/2020);    Time 12    Period Weeks    Status Achieved      PT LONG TERM GOAL #4   Title Reduce pain with functional activities to equal or less than 1/10 to allow patient to complete usual activities including ADLs, IADLs, and educate her children with less difficulty.    Baseline up to 10/10 pain (05/22/2020); up to 3/10 in last 2 weeks (07/31/2020);    Time 12    Period Weeks    Status Partially Met      PT LONG TERM GOAL #5   Title Complete community, work and/or recreational activities without limitation due to current condition.    Baseline Functional Limitations: difficulty with usual activities including cleaning dishes, using bathroom, homeschooling and caring for children, bending, lifting, twisting, sleeping,driving/travleing, dancing, walking, etc (05/22/2020); Much better from her original limitations. Still has some trouble with a lot of cleaning or bending forward for long periods of time, prolonged sitting bothers her some (07/31/2020);    Time 12    Period Weeks    Status Partially Met                 Plan - 07/31/20 2006    Clinical Impression Statement Patient has attended 10 physical therapy sessions this episode of care and made steady progress towards goals, meeting or nearly meeting all of them. Patient reports she feels confident in continuing self management with her HEP and reports significant improvement from 49 to 70 on her FOTO score which exceeds expectation of 63 and reflects good improvement in self-reported function. Patient is now discharged from PT due to improvement in condition.    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    Examination-Activity Limitations Hygiene/Grooming;Squat;Bed Mobility;Lift;Stairs;Bend;Locomotion Level;Stand;Caring for  Others;Toileting;Carry;Sit;Sleep;Dressing;Transfers    Examination-Participation Restrictions Laundry;School;Cleaning;Community Activity;Meal Prep;Shop;Driving;Occupation;Yard Work;Interpersonal Relationship   difficulty with usual activities including cleaning dishes, using bathroom, homeschooling and caring for children, bending, lifting, twisting, sleeping,driving/travleing, dancing, walking, etc.   Stability/Clinical Decision Making Evolving/Moderate complexity    Rehab Potential Good    PT Frequency 2x / week    PT Duration 12 weeks  PT Treatment/Interventions ADLs/Self Care Home Management;Cryotherapy;Traction;Moist Heat;Electrical Stimulation;DME Instruction;Therapeutic activities;Therapeutic exercise;Neuromuscular re-education;Manual techniques;Patient/family education;Passive range of motion;Dry needling;Joint Manipulations;Spinal Manipulations    PT Next Visit Plan Patient is now discharged from PT due to improvement in condition.    PT Home Exercise Plan Medbridge Access Code: [email or web address]    Consulted and Agree with Plan of Care Patient           Patient will benefit from skilled therapeutic intervention in order to improve the following deficits and impairments:  Pain,Decreased mobility,Increased muscle spasms,Decreased activity tolerance,Decreased endurance,Decreased range of motion,Decreased strength,Hypomobility,Impaired perceived functional ability,Difficulty walking,Impaired flexibility  Visit Diagnosis: Chronic right-sided low back pain with right-sided sciatica  Difficulty in walking, not elsewhere classified     Problem List Patient Active Problem List   Diagnosis Date Noted  . Labor and delivery, indication for care 09/03/2017  . Pelvic cramping 04/16/2016  . First trimester bleeding 04/16/2016  . Vulvar itching 04/16/2016  . Vaginal bleeding 04/16/2016  . H/O female dyspareunia 02/06/2016  . Family history of endometriosis 02/06/2016  . History of dysmenorrhea  02/06/2016  . Overweight (BMI 25.0-29.9) 02/06/2016  . GBS carrier 02/06/2016    Everlean Alstrom. Graylon Good, PT, DPT 07/31/20, 8:07 PM  [facility] [address], [place], [address] Phone: [phone]   Fax:  [phone]  Name: Sheila Baker MRN: [id number] Date of Birth: 1980-08-12

## 2020-08-07 ENCOUNTER — Ambulatory Visit: Payer: 59 | Admitting: Physical Therapy

## 2020-08-14 ENCOUNTER — Encounter: Payer: 59 | Admitting: Physical Therapy

## 2021-05-08 ENCOUNTER — Other Ambulatory Visit: Payer: Self-pay | Admitting: Family Medicine

## 2021-05-08 DIAGNOSIS — N644 Mastodynia: Secondary | ICD-10-CM

## 2021-05-09 ENCOUNTER — Other Ambulatory Visit: Payer: Self-pay | Admitting: *Deleted

## 2021-05-09 ENCOUNTER — Inpatient Hospital Stay
Admission: RE | Admit: 2021-05-09 | Discharge: 2021-05-09 | Disposition: A | Discharge status: Home / Self Care | Payer: Self-pay | Source: Ambulatory Visit | Attending: *Deleted | Admitting: *Deleted

## 2021-05-09 DIAGNOSIS — Z1231 Encounter for screening mammogram for malignant neoplasm of breast: Secondary | ICD-10-CM

## 2021-05-28 ENCOUNTER — Ambulatory Visit
Admission: RE | Admit: 2021-05-28 | Discharge: 2021-05-28 | Disposition: A | Discharge status: Home / Self Care | Payer: BC Managed Care – PPO | Source: Ambulatory Visit | Attending: Family Medicine | Admitting: Family Medicine

## 2021-05-28 ENCOUNTER — Other Ambulatory Visit: Payer: Self-pay

## 2021-05-28 DIAGNOSIS — N644 Mastodynia: Secondary | ICD-10-CM

## 2021-06-02 ENCOUNTER — Other Ambulatory Visit: Payer: Self-pay | Admitting: Family Medicine

## 2021-06-02 DIAGNOSIS — R921 Mammographic calcification found on diagnostic imaging of breast: Secondary | ICD-10-CM

## 2021-06-02 DIAGNOSIS — R928 Other abnormal and inconclusive findings on diagnostic imaging of breast: Secondary | ICD-10-CM

## 2021-06-16 ENCOUNTER — Ambulatory Visit
Admission: RE | Admit: 2021-06-16 | Discharge: 2021-06-16 | Disposition: A | Discharge status: Home / Self Care | Payer: BC Managed Care – PPO | Source: Ambulatory Visit | Attending: Family Medicine | Admitting: Family Medicine

## 2021-06-16 DIAGNOSIS — R928 Other abnormal and inconclusive findings on diagnostic imaging of breast: Secondary | ICD-10-CM

## 2021-06-16 DIAGNOSIS — R921 Mammographic calcification found on diagnostic imaging of breast: Secondary | ICD-10-CM | POA: Insufficient documentation

## 2021-06-17 LAB — SURGICAL PATHOLOGY

## 2021-11-25 ENCOUNTER — Other Ambulatory Visit: Payer: Self-pay | Admitting: Family Medicine

## 2021-11-25 DIAGNOSIS — R921 Mammographic calcification found on diagnostic imaging of breast: Secondary | ICD-10-CM

## 2021-12-23 ENCOUNTER — Ambulatory Visit
Admission: RE | Admit: 2021-12-23 | Discharge: 2021-12-23 | Disposition: A | Discharge status: Home / Self Care | Payer: BC Managed Care – PPO | Source: Ambulatory Visit | Attending: Family Medicine | Admitting: Family Medicine

## 2021-12-23 DIAGNOSIS — R921 Mammographic calcification found on diagnostic imaging of breast: Secondary | ICD-10-CM | POA: Diagnosis present

## 2022-05-28 ENCOUNTER — Other Ambulatory Visit: Payer: Self-pay | Admitting: Obstetrics and Gynecology

## 2022-06-22 NOTE — Patient Instructions (Signed)
Your procedure is scheduled on: Wednesday July 01, 2022. Report to the Registration Desk on the 1st floor of the [facility]. To find out your arrival time, please call [phone] between 1PM - 3PM on: Tuesday June 30, 2022. If your arrival time is 6:00 am, do not arrive before that time as the [facility] entrance doors do not open until 6:00 am.  REMEMBER: Instructions that are not followed completely may result in serious medical risk, up to and including death; or upon the discretion of your surgeon and anesthesiologist your surgery may need to be rescheduled.  Do not eat food after midnight the night before surgery.  No gum chewing or hard candies.  You may however, drink CLEAR liquids up to 2 hours before you are scheduled to arrive for your surgery. Do not drink anything within 2 hours of your scheduled arrival time.  Clear liquids include: - water  - apple juice without pulp - gatorade (not RED colors) - black coffee or tea (Do NOT add milk or creamers to the coffee or tea) Do NOT drink anything that is not on this list.   One week prior to surgery: Stop Anti-inflammatories (NSAIDS) such as Advil, Aleve, Ibuprofen, Motrin, Naproxen, Naprosyn, etodolac (LODINE) and Aspirin based products such as Excedrin, Goody's Powder, BC Powder. Stop ALL OVER THE COUNTER supplements until after surgery. You may however, continue to take Tylenol if needed for pain up until the day of surgery.  Continue taking all prescribed medications with the exception of the following:   Follow recommendations from Cardiologist or PCP regarding stopping blood thinners.  TAKE ONLY THESE MEDICATIONS THE MORNING OF SURGERY WITH A SIP OF WATER:  None    No Alcohol for 24 hours before or after surgery.  No Smoking including e-cigarettes for 24 hours before surgery.  No chewable tobacco products for at least 6 hours before surgery.  No nicotine patches on the day of surgery.  Do not use any  "recreational" drugs for at least a week (preferably 2 weeks) before your surgery.  Please be advised that the combination of cocaine and anesthesia may have negative outcomes, up to and including death. If you test positive for cocaine, your surgery will be cancelled.  On the morning of surgery brush your teeth with toothpaste and water, you may rinse your mouth with mouthwash if you wish. Do not swallow any toothpaste or mouthwash.  Use CHG Soap or wipes as directed on instruction sheet.  Do not wear jewelry, make-up, hairpins, clips or nail polish.  Do not wear lotions, powders, or perfumes.   Do not shave body hair from the neck down 48 hours before surgery.  Contact lenses, hearing aids and dentures may not be worn into surgery.  Do not bring valuables to the hospital. [facility] is not responsible for any missing/lost belongings or valuables.    Notify your doctor if there is any change in your medical condition (cold, fever, infection).  Wear comfortable clothing (specific to your surgery type) to the hospital.  After surgery, you can help prevent lung complications by doing breathing exercises.  Take deep breaths and cough every 1-2 hours. Your doctor may order a device called an Incentive Spirometer to help you take deep breaths. When coughing or sneezing, hold a pillow firmly against your incision with both hands. This is called "splinting." Doing this helps protect your incision. It also decreases belly discomfort.  If you are being admitted to the hospital overnight, leave your  suitcase in the car. After surgery it may be brought to your room.  In case of increased patient census, it may be necessary for you, the patient, to continue your postoperative care in the Same Day Surgery department.  If you are being discharged the day of surgery, you will not be allowed to drive home. You will need a responsible individual to drive you home and stay with you for 24 hours after  surgery.   If you are taking public transportation, you will need to have a responsible individual with you.  Please call the Pre-admissions Testing Dept. at [phone] if you have any questions about these instructions.  Surgery Visitation Policy:  Patients having surgery or a procedure may have two visitors.  Children under the age of 55 must have an adult with them who is not the patient.  Inpatient Visitation:    Visiting hours are 7 a.m. to 8 p.m. Up to four visitors are allowed at one time in a patient room. The visitors may rotate out with other people during the day.  One visitor age 91 or older may stay with the patient overnight and must be in the room by 8 p.m.

## 2022-06-23 ENCOUNTER — Other Ambulatory Visit: Payer: Self-pay | Admitting: Obstetrics and Gynecology

## 2022-06-23 ENCOUNTER — Other Ambulatory Visit: Payer: Self-pay

## 2022-06-23 ENCOUNTER — Encounter (HOSPITAL_COMMUNITY): Payer: Self-pay | Admitting: Urgent Care

## 2022-06-23 ENCOUNTER — Encounter
Admission: RE | Admit: 2022-06-23 | Discharge: 2022-06-23 | Disposition: A | Discharge status: Home / Self Care | Payer: BC Managed Care – PPO | Source: Ambulatory Visit | Attending: Obstetrics and Gynecology | Admitting: Obstetrics and Gynecology

## 2022-06-23 ENCOUNTER — Encounter: Payer: Self-pay | Admitting: Obstetrics and Gynecology

## 2022-06-23 VITALS — Ht 65.0 in | Wt 146.0 lb

## 2022-06-23 DIAGNOSIS — Z01812 Encounter for preprocedural laboratory examination: Secondary | ICD-10-CM

## 2022-06-23 HISTORY — DX: Anemia, unspecified: D64.9
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
## 2022-07-01 ENCOUNTER — Ambulatory Visit
Admission: RE | Admit: 2022-07-01 | Payer: BC Managed Care – PPO | Source: Home / Self Care | Admitting: Obstetrics and Gynecology

## 2022-07-01 ENCOUNTER — Encounter: Admission: RE | Payer: Self-pay | Source: Home / Self Care

## 2022-07-01 DIAGNOSIS — N920 Excessive and frequent menstruation with regular cycle: Secondary | ICD-10-CM

## 2022-07-01 HISTORY — DX: Postpartum depression: F53.0

## 2022-07-01 HISTORY — DX: Migraine, unspecified, not intractable, without status migrainosus: G43.909

## 2022-07-01 HISTORY — DX: Excessive and frequent menstruation with regular cycle: N92.0

## 2022-07-01 HISTORY — DX: Adult sexual abuse, confirmed, initial encounter: T74.21XA

## 2022-07-01 HISTORY — DX: Gilbert syndrome: E80.4

## 2022-07-01 HISTORY — DX: Gastro-esophageal reflux disease without esophagitis: K21.9

## 2022-07-01 HISTORY — DX: Premenstrual dysphoric disorder: F32.81

## 2022-07-01 HISTORY — DX: Hyperlipidemia, unspecified: E78.5

## 2022-07-01 HISTORY — DX: Depression, unspecified: F32.A

## 2022-07-01 HISTORY — DX: Anxiety disorder, unspecified: F41.9

## 2022-07-01 SURGERY — DILATATION & CURETTAGE/HYSTEROSCOPY WITH NOVASURE ABLATION
Anesthesia: Choice
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
## 2023-05-27 ENCOUNTER — Other Ambulatory Visit: Payer: Self-pay

## 2023-05-27 ENCOUNTER — Encounter
Admission: RE | Admit: 2023-05-27 | Discharge: 2023-05-27 | Disposition: A | Discharge status: Home / Self Care | Source: Ambulatory Visit | Attending: Obstetrics and Gynecology | Admitting: Obstetrics and Gynecology

## 2023-05-27 DIAGNOSIS — Z01812 Encounter for preprocedural laboratory examination: Secondary | ICD-10-CM

## 2023-05-27 HISTORY — DX: Other complications of anesthesia, initial encounter: T88.59XA

## 2023-05-27 HISTORY — DX: Excessive and frequent menstruation with regular cycle: N92.0

## 2023-05-27 HISTORY — DX: Leiomyoma of uterus, unspecified: D25.9

## 2023-05-27 NOTE — Patient Instructions (Addendum)
 Your procedure is scheduled on: 06/03/23 - Thursday Report to the Registration Desk on the 1st floor of the [facility] - [address] [place] To find out your arrival time, please call [phone] between 1PM - 3PM on: 06/02/23 - Wednesday If your arrival time is 6:00 am, do not arrive before that time as the [facility] entrance doors do not open until 6:00 am.  REMEMBER: Instructions that are not followed completely may result in serious medical risk, up to and including death; or upon the discretion of your surgeon and anesthesiologist your surgery may need to be rescheduled.  Do not eat food after midnight the night before surgery.  No gum chewing or hard candies.  You may however, drink CLEAR liquids up to 2 hours before you are scheduled to arrive for your surgery. Do not drink anything within 2 hours of your scheduled arrival time.  Clear liquids include: - water  - apple juice without pulp - gatorade (not RED colors) - black coffee or tea (Do NOT add milk or creamers to the coffee or tea) Do NOT drink anything that is not on this list.   In addition, your doctor has ordered for you to drink the provided:  Ensure Pre-Surgery Clear Carbohydrate Drink  Drinking this carbohydrate drink up to two hours before surgery helps to reduce insulin resistance and improve patient outcomes. Please complete drinking 2 hours before scheduled arrival time.  One week prior to surgery: Stop Anti-inflammatories (NSAIDS) such as Advil, Aleve, Ibuprofen, Motrin, Naproxen, Naprosyn and Aspirin based products such as Excedrin, Goody's Powder, BC Powder. You may take Tylenol if needed for pain up until the day of surgery.  Stop ANY OVER THE COUNTER supplements until after surgery.   ON THE DAY OF SURGERY ONLY TAKE THESE MEDICATIONS WITH SIPS OF WATER:  none   No Alcohol for 24 hours before or after surgery.  No Smoking including e-cigarettes for 24 hours before surgery.  No  chewable tobacco products for at least 6 hours before surgery.  No nicotine patches on the day of surgery.  Do not use any "recreational" drugs for at least a week (preferably 2 weeks) before your surgery.  Please be advised that the combination of cocaine and anesthesia may have negative outcomes, up to and including death. If you test positive for cocaine, your surgery will be cancelled.  On the morning of surgery brush your teeth with toothpaste and water, you may rinse your mouth with mouthwash if you wish. Do not swallow any toothpaste or mouthwash.  Use CHG Soap or wipes as directed on instruction sheet.  Do not wear jewelry, make-up, hairpins, clips or nail polish.  For welded (permanent) jewelry: bracelets, anklets, waist bands, etc.  Please have this removed prior to surgery.  If it is not removed, there is a chance that hospital personnel will need to cut it off on the day of surgery.  Do not wear lotions, powders, or perfumes.   Do not shave body hair from the neck down 48 hours before surgery.  Contact lenses, hearing aids and dentures may not be worn into surgery.  Do not bring valuables to the hospital. [facility] is not responsible for any missing/lost belongings or valuables.   Notify your doctor if there is any change in your medical condition (cold, fever, infection).  Wear comfortable clothing (specific to your surgery type) to the hospital.  After surgery, you can help prevent lung complications by doing breathing exercises.  Take deep breaths and cough every 1-2 hours. Your doctor may order a device called an Incentive Spirometer to help you take deep breaths.  When coughing or sneezing, hold a pillow firmly against your incision with both hands. This is called "splinting." Doing this helps protect your incision. It also decreases belly discomfort.  If you are being admitted to the hospital overnight, leave your suitcase in the car. After surgery it may be  brought to your room.  In case of increased patient census, it may be necessary for you, the patient, to continue your postoperative care in the Same Day Surgery department.  If you are being discharged the day of surgery, you will not be allowed to drive home. You will need a responsible individual to drive you home and stay with you for 24 hours after surgery.   If you are taking public transportation, you will need to have a responsible individual with you.  Please call the Pre-admissions Testing Dept. at [phone] if you have any questions about these instructions.  Surgery Visitation Policy:  Patients having surgery or a procedure may have two visitors.  Children under the age of 13 must have an adult with them who is not the patient.  Temporary Visitor Restrictions Due to increasing cases of flu, RSV and COVID-19: Children ages 11 and under will not be able to visit patients in [facility] hospitals under most circumstances.  Inpatient Visitation:    Visiting hours are 7 a.m. to 8 p.m. Up to four visitors are allowed at one time in a patient room. The visitors may rotate out with other people during the day.  One visitor age 77 or older may stay with the patient overnight and must be in the room by 8 p.m.    Preparing for Surgery with CHLORHEXIDINE GLUCONATE (CHG) Soap  Chlorhexidine Gluconate (CHG) Soap  o An antiseptic cleaner that kills germs and bonds with the skin to continue killing germs even after washing  o Used for showering the night before surgery and morning of surgery  Before surgery, you can play an important role by reducing the number of germs on your skin.  CHG (Chlorhexidine gluconate) soap is an antiseptic cleanser which kills germs and bonds with the skin to continue killing germs even after washing.  Please do not use if you have an allergy to CHG or antibacterial soaps. If your skin becomes reddened/irritated stop using the CHG.  1. Shower  the NIGHT BEFORE SURGERY and the MORNING OF SURGERY with CHG soap.  2. If you choose to wash your hair, wash your hair first as usual with your normal shampoo.  3. After shampooing, rinse your hair and body thoroughly to remove the shampoo.  4. Use CHG as you would any other liquid soap. You can apply CHG directly to the skin and wash gently with a scrungie or a clean washcloth.  5. Apply the CHG soap to your body only from the neck down. Do not use on open wounds or open sores. Avoid contact with your eyes, ears, mouth, and genitals (private parts). Wash face and genitals (private parts) with your normal soap.  6. Wash thoroughly, paying special attention to the area where your surgery will be performed.  7. Thoroughly rinse your body with warm water.  8. Do not shower/wash with your normal soap after using and rinsing off the CHG soap.  9. Pat yourself dry with a clean towel.  10. Wear clean pajamas to bed the  night before surgery.  12. Place clean sheets on your bed the night of your first shower and do not sleep with pets.  13. Shower again with the CHG soap on the day of surgery prior to arriving at the hospital.  14. Do not apply any deodorants/lotions/powders.  15. Please wear clean clothes to the hospital.

## 2023-06-01 ENCOUNTER — Encounter
Admission: RE | Admit: 2023-06-01 | Discharge: 2023-06-01 | Disposition: A | Discharge status: Home / Self Care | Source: Ambulatory Visit | Attending: Obstetrics and Gynecology | Admitting: Obstetrics and Gynecology

## 2023-06-01 DIAGNOSIS — N920 Excessive and frequent menstruation with regular cycle: Secondary | ICD-10-CM | POA: Insufficient documentation

## 2023-06-01 DIAGNOSIS — K219 Gastro-esophageal reflux disease without esophagitis: Secondary | ICD-10-CM | POA: Diagnosis not present

## 2023-06-01 DIAGNOSIS — N838 Other noninflammatory disorders of ovary, fallopian tube and broad ligament: Secondary | ICD-10-CM | POA: Diagnosis not present

## 2023-06-01 DIAGNOSIS — N8003 Adenomyosis of the uterus: Secondary | ICD-10-CM | POA: Diagnosis not present

## 2023-06-01 DIAGNOSIS — G8929 Other chronic pain: Secondary | ICD-10-CM | POA: Diagnosis not present

## 2023-06-01 DIAGNOSIS — N80329 Endometriosis of the posterior cul-de-sac, unspecified depth: Secondary | ICD-10-CM | POA: Diagnosis not present

## 2023-06-01 DIAGNOSIS — R102 Pelvic and perineal pain: Secondary | ICD-10-CM | POA: Diagnosis not present

## 2023-06-01 DIAGNOSIS — Z01812 Encounter for preprocedural laboratory examination: Secondary | ICD-10-CM | POA: Insufficient documentation

## 2023-06-01 DIAGNOSIS — D251 Intramural leiomyoma of uterus: Secondary | ICD-10-CM | POA: Diagnosis not present

## 2023-06-01 LAB — CBC
HCT: 40.4 % (ref 36.0–46.0)
Hemoglobin: 13.6 g/dL (ref 12.0–15.0)
MCH: 29.8 pg (ref 26.0–34.0)
MCHC: 33.7 g/dL (ref 30.0–36.0)
MCV: 88.6 fL (ref 80.0–100.0)
Platelets: 247 10*3/uL (ref 150–400)
RBC: 4.56 MIL/uL (ref 3.87–5.11)
RDW: 12.6 % (ref 11.5–15.5)
WBC: 6.1 10*3/uL (ref 4.0–10.5)
nRBC: 0 % (ref 0.0–0.2)

## 2023-06-01 LAB — TYPE AND SCREEN
ABO/RH(D): A POS
Antibody Screen: NEGATIVE

## 2023-06-02 MED ORDER — LACTATED RINGERS IV SOLN: INTRAVENOUS | Status: DC

## 2023-06-02 MED ORDER — CHLORHEXIDINE GLUCONATE 0.12 % MT SOLN
15.0000 mL | Freq: Once | OROMUCOSAL | Status: AC
  Administered 2023-06-03: 15 mL via OROMUCOSAL

## 2023-06-02 MED ORDER — CEFAZOLIN SODIUM-DEXTROSE 2-4 GM/100ML-% IV SOLN
2.0000 g | INTRAVENOUS | Status: AC
  Administered 2023-06-03: 2 g via INTRAVENOUS

## 2023-06-02 MED ORDER — SODIUM CHLORIDE 0.9 % IV SOLN: INTRAVENOUS | Status: AC

## 2023-06-02 MED ORDER — ORAL CARE MOUTH RINSE: 15.0000 mL | Freq: Once | OROMUCOSAL | Status: AC

## 2023-06-02 MED ORDER — SOD CITRATE-CITRIC ACID 500-334 MG/5ML PO SOLN
30.0000 mL | ORAL | Status: DC
  Filled 2023-06-02: qty 30

## 2023-06-03 ENCOUNTER — Other Ambulatory Visit: Payer: Self-pay

## 2023-06-03 ENCOUNTER — Encounter: Payer: Self-pay | Admitting: Obstetrics and Gynecology

## 2023-06-03 ENCOUNTER — Ambulatory Visit
Admission: RE | Admit: 2023-06-03 | Discharge: 2023-06-03 | Disposition: A | Discharge status: Home / Self Care | Attending: Obstetrics and Gynecology | Admitting: Obstetrics and Gynecology

## 2023-06-03 ENCOUNTER — Ambulatory Visit: Payer: Self-pay | Admitting: Urgent Care

## 2023-06-03 ENCOUNTER — Encounter
Admission: RE | Disposition: A | Discharge status: Home / Self Care | Payer: Self-pay | Source: Home / Self Care | Attending: Obstetrics and Gynecology

## 2023-06-03 ENCOUNTER — Ambulatory Visit: Admitting: Certified Registered"

## 2023-06-03 DIAGNOSIS — N920 Excessive and frequent menstruation with regular cycle: Secondary | ICD-10-CM | POA: Diagnosis not present

## 2023-06-03 DIAGNOSIS — N80329 Endometriosis of the posterior cul-de-sac, unspecified depth: Secondary | ICD-10-CM | POA: Insufficient documentation

## 2023-06-03 DIAGNOSIS — G8929 Other chronic pain: Secondary | ICD-10-CM | POA: Insufficient documentation

## 2023-06-03 DIAGNOSIS — N8003 Adenomyosis of the uterus: Secondary | ICD-10-CM | POA: Insufficient documentation

## 2023-06-03 DIAGNOSIS — D219 Benign neoplasm of connective and other soft tissue, unspecified: Secondary | ICD-10-CM | POA: Diagnosis present

## 2023-06-03 DIAGNOSIS — K219 Gastro-esophageal reflux disease without esophagitis: Secondary | ICD-10-CM | POA: Insufficient documentation

## 2023-06-03 DIAGNOSIS — D251 Intramural leiomyoma of uterus: Secondary | ICD-10-CM | POA: Insufficient documentation

## 2023-06-03 DIAGNOSIS — N838 Other noninflammatory disorders of ovary, fallopian tube and broad ligament: Secondary | ICD-10-CM | POA: Insufficient documentation

## 2023-06-03 DIAGNOSIS — Z01812 Encounter for preprocedural laboratory examination: Secondary | ICD-10-CM | POA: Insufficient documentation

## 2023-06-03 DIAGNOSIS — R102 Pelvic and perineal pain: Secondary | ICD-10-CM | POA: Insufficient documentation

## 2023-06-03 HISTORY — PX: CYSTOSCOPY: SHX5120

## 2023-06-03 LAB — POCT PREGNANCY, URINE: Preg Test, Ur: NEGATIVE

## 2023-06-03 SURGERY — HYSTERECTOMY, TOTAL, LAPAROSCOPIC, ROBOT-ASSISTED WITH SALPINGECTOMY
Anesthesia: General | Site: Bladder

## 2023-06-03 MED ORDER — CHLORHEXIDINE GLUCONATE 0.12 % MT SOLN
OROMUCOSAL | Status: AC
  Filled 2023-06-03: qty 15

## 2023-06-03 MED ORDER — BUPIVACAINE HCL (PF) 0.5 % IJ SOLN
INTRAMUSCULAR | Status: AC
  Filled 2023-06-03: qty 30

## 2023-06-03 MED ORDER — IBUPROFEN 600 MG PO TABS
600.0000 mg | ORAL_TABLET | Freq: Once | ORAL | Status: AC
  Administered 2023-06-03: 600 mg via ORAL
  Filled 2023-06-03: qty 1

## 2023-06-03 MED ORDER — HEMOSTATIC AGENTS (NO CHARGE) OPTIME
TOPICAL | Status: DC | PRN
  Administered 2023-06-03: 1

## 2023-06-03 MED ORDER — FENTANYL CITRATE (PF) 100 MCG/2ML IJ SOLN
INTRAMUSCULAR | Status: AC
  Filled 2023-06-03: qty 2

## 2023-06-03 MED ORDER — ONDANSETRON HCL 4 MG/2ML IJ SOLN
INTRAMUSCULAR | Status: DC | PRN
  Administered 2023-06-03: 4 mg via INTRAVENOUS

## 2023-06-03 MED ORDER — BUPIVACAINE HCL 0.5 % IJ SOLN
INTRAMUSCULAR | Status: DC | PRN
  Administered 2023-06-03: 10 mL

## 2023-06-03 MED ORDER — ONDANSETRON HCL 4 MG/2ML IJ SOLN
4.0000 mg | Freq: Once | INTRAMUSCULAR | Status: AC | PRN
  Administered 2023-06-03: 4 mg via INTRAVENOUS

## 2023-06-03 MED ORDER — LIDOCAINE HCL (CARDIAC) PF 100 MG/5ML IV SOSY
PREFILLED_SYRINGE | INTRAVENOUS | Status: DC | PRN
Start: 2023-06-03 — End: 2023-06-03
  Administered 2023-06-03: 40 mg via INTRAVENOUS
  Administered 2023-06-03 (×2): 60 mg via INTRAVENOUS

## 2023-06-03 MED ORDER — OXYCODONE HCL 5 MG PO TABS
ORAL_TABLET | ORAL | Status: AC
  Filled 2023-06-03: qty 1

## 2023-06-03 MED ORDER — DEXMEDETOMIDINE HCL IN NACL 80 MCG/20ML IV SOLN
INTRAVENOUS | Status: DC | PRN
  Administered 2023-06-03 (×3): 12 ug via INTRAVENOUS

## 2023-06-03 MED ORDER — ONDANSETRON HCL 4 MG/2ML IJ SOLN
INTRAMUSCULAR | Status: AC
  Filled 2023-06-03: qty 2

## 2023-06-03 MED ORDER — 0.9 % SODIUM CHLORIDE (POUR BTL) OPTIME
TOPICAL | Status: DC | PRN
  Administered 2023-06-03: 500 mL

## 2023-06-03 MED ORDER — IBUPROFEN 600 MG PO TABS: 600.0000 mg | ORAL_TABLET | Freq: Four times a day (QID) | ORAL | 0 refills | Status: AC

## 2023-06-03 MED ORDER — IBUPROFEN 600 MG PO TABS
ORAL_TABLET | ORAL | Status: AC
Start: 2023-06-03 — End: ?
  Filled 2023-06-03: qty 1

## 2023-06-03 MED ORDER — KETAMINE HCL 50 MG/5ML IJ SOSY
PREFILLED_SYRINGE | INTRAMUSCULAR | Status: DC | PRN
Start: 2023-06-03 — End: 2023-06-03
  Administered 2023-06-03: 30 mg via INTRAVENOUS
  Administered 2023-06-03: 20 mg via INTRAVENOUS

## 2023-06-03 MED ORDER — PROPOFOL 1000 MG/100ML IV EMUL
INTRAVENOUS | Status: AC
  Filled 2023-06-03: qty 100

## 2023-06-03 MED ORDER — ROCURONIUM BROMIDE 100 MG/10ML IV SOLN
INTRAVENOUS | Status: DC | PRN
  Administered 2023-06-03: 10 mg via INTRAVENOUS
  Administered 2023-06-03: 20 mg via INTRAVENOUS
  Administered 2023-06-03: 30 mg via INTRAVENOUS
  Administered 2023-06-03: 10 mg via INTRAVENOUS
  Administered 2023-06-03: 50 mg via INTRAVENOUS
  Administered 2023-06-03: 20 mg via INTRAVENOUS

## 2023-06-03 MED ORDER — ACETAMINOPHEN 10 MG/ML IV SOLN
INTRAVENOUS | Status: DC | PRN
  Administered 2023-06-03: 1000 mg via INTRAVENOUS

## 2023-06-03 MED ORDER — MIDAZOLAM HCL 2 MG/2ML IJ SOLN
INTRAMUSCULAR | Status: DC | PRN
  Administered 2023-06-03: 2 mg via INTRAVENOUS

## 2023-06-03 MED ORDER — FENTANYL CITRATE (PF) 100 MCG/2ML IJ SOLN
INTRAMUSCULAR | Status: DC | PRN
  Administered 2023-06-03 (×4): 50 ug via INTRAVENOUS

## 2023-06-03 MED ORDER — ONDANSETRON 4 MG PO TBDP: 4.0000 mg | ORAL_TABLET | Freq: Four times a day (QID) | ORAL | 0 refills | Status: AC | PRN

## 2023-06-03 MED ORDER — FENTANYL CITRATE (PF) 100 MCG/2ML IJ SOLN
25.0000 ug | INTRAMUSCULAR | Status: DC | PRN
  Administered 2023-06-03 (×6): 25 ug via INTRAVENOUS

## 2023-06-03 MED ORDER — OXYCODONE-ACETAMINOPHEN 5-325 MG PO TABS: 1.0000 | ORAL_TABLET | Freq: Four times a day (QID) | ORAL | 0 refills | Status: AC | PRN

## 2023-06-03 MED ORDER — DEXAMETHASONE SODIUM PHOSPHATE 10 MG/ML IJ SOLN
INTRAMUSCULAR | Status: DC | PRN
Start: 2023-06-03 — End: 2023-06-03
  Administered 2023-06-03: 10 mg via INTRAVENOUS

## 2023-06-03 MED ORDER — OXYCODONE HCL 5 MG PO TABS
5.0000 mg | ORAL_TABLET | Freq: Once | ORAL | Status: AC
  Administered 2023-06-03: 5 mg via ORAL

## 2023-06-03 MED ORDER — KETOROLAC TROMETHAMINE 30 MG/ML IJ SOLN
INTRAMUSCULAR | Status: AC
  Filled 2023-06-03: qty 1

## 2023-06-03 MED ORDER — SUGAMMADEX SODIUM 200 MG/2ML IV SOLN
INTRAVENOUS | Status: DC | PRN
  Administered 2023-06-03: 200 mg via INTRAVENOUS

## 2023-06-03 MED ORDER — KETOROLAC TROMETHAMINE 30 MG/ML IJ SOLN
30.0000 mg | Freq: Once | INTRAMUSCULAR | Status: AC
  Administered 2023-06-03: 30 mg via INTRAVENOUS

## 2023-06-03 MED ORDER — PROPOFOL 10 MG/ML IV BOLUS
INTRAVENOUS | Status: DC | PRN
  Administered 2023-06-03: 100 mg via INTRAVENOUS
  Administered 2023-06-03: 150 ug/kg/min via INTRAVENOUS

## 2023-06-03 MED ORDER — SODIUM CHLORIDE 0.9 % IR SOLN
Status: DC | PRN
  Administered 2023-06-03: 200 mL

## 2023-06-03 MED ORDER — KETAMINE HCL 50 MG/5ML IJ SOSY
PREFILLED_SYRINGE | INTRAMUSCULAR | Status: AC
  Filled 2023-06-03: qty 5

## 2023-06-03 MED ORDER — MIDAZOLAM HCL 2 MG/2ML IJ SOLN
INTRAMUSCULAR | Status: AC
  Filled 2023-06-03: qty 2

## 2023-06-03 MED ORDER — ACETAMINOPHEN 10 MG/ML IV SOLN
INTRAVENOUS | Status: AC
  Filled 2023-06-03: qty 100

## 2023-06-03 MED ORDER — CEFAZOLIN SODIUM-DEXTROSE 2-4 GM/100ML-% IV SOLN
INTRAVENOUS | Status: AC
  Filled 2023-06-03: qty 100

## 2023-06-03 SURGICAL SUPPLY — 67 items
APPLICATOR ARISTA FLEXITIP XL (MISCELLANEOUS) IMPLANT
BAG URINE DRAIN 2000ML AR STRL (UROLOGICAL SUPPLIES) ×2 IMPLANT
BLADE SURG 15 STRL LF DISP TIS (BLADE) ×2 IMPLANT
BLADE SURG SZ11 CARB STEEL (BLADE) ×2 IMPLANT
CANNULA CAP OBTURATR AIRSEAL 8 (CAP) ×2 IMPLANT
CATH URTH 16FR FL 2W BLN LF (CATHETERS) ×2 IMPLANT
COVER TIP SHEARS 8 DVNC (MISCELLANEOUS) ×2 IMPLANT
DERMABOND ADVANCED .7 DNX12 (GAUZE/BANDAGES/DRESSINGS) ×2 IMPLANT
DRAPE ARM DVNC X/XI (DISPOSABLE) ×8 IMPLANT
DRAPE COLUMN DVNC XI (DISPOSABLE) ×2 IMPLANT
DRAPE UNDER BUTTOCK W/FLU (DRAPES) ×2 IMPLANT
DRIVER NDL MEGA SUTCUT DVNCXI (INSTRUMENTS) ×2 IMPLANT
DRIVER NDLE MEGA SUTCUT DVNCXI (INSTRUMENTS) ×2 IMPLANT
DRSG TELFA 4X3 1S NADH ST (GAUZE/BANDAGES/DRESSINGS) IMPLANT
ELECT REM PT RETURN 9FT ADLT (ELECTROSURGICAL) ×2 IMPLANT
ELECTRODE REM PT RTRN 9FT ADLT (ELECTROSURGICAL) ×2 IMPLANT
FORCEPS BPLR FENES DVNC XI (FORCEP) ×2 IMPLANT
FORCEPS BPLR R/ABLATION 8 DVNC (INSTRUMENTS) ×2 IMPLANT
GAUZE 4X4 16PLY ~~LOC~~+RFID DBL (SPONGE) ×2 IMPLANT
GLOVE BIO SURGEON STRL SZ7 (GLOVE) ×6 IMPLANT
GLOVE BIOGEL PI IND STRL 7.5 (GLOVE) ×6 IMPLANT
GOWN STRL REUS W/ TWL LRG LVL3 (GOWN DISPOSABLE) ×6 IMPLANT
GOWN STRL REUS W/ TWL XL LVL3 (GOWN DISPOSABLE) ×2 IMPLANT
HEMOSTAT ARISTA ABSORB 3G PWDR (HEMOSTASIS) IMPLANT
IRRIGATION STRYKERFLOW (MISCELLANEOUS) IMPLANT
IRRIGATOR STRYKERFLOW (MISCELLANEOUS) IMPLANT
IRRIGATOR SUCT 8 DISP DVNC XI (IRRIGATION / IRRIGATOR) IMPLANT
IV LACTATED RINGERS 1000ML (IV SOLUTION) ×2 IMPLANT
IV NS 1000ML BAXH (IV SOLUTION) ×2 IMPLANT
KIT PINK PAD W/HEAD ARE REST (MISCELLANEOUS) ×2 IMPLANT
KIT PINK PAD W/HEAD ARM REST (MISCELLANEOUS) ×2 IMPLANT
KIT TURNOVER CYSTO (KITS) ×2 IMPLANT
LABEL OR SOLS (LABEL) ×2 IMPLANT
MANIFOLD NEPTUNE II (INSTRUMENTS) ×2 IMPLANT
MANIPULATOR VCARE LG CRV RETR (MISCELLANEOUS) IMPLANT
MANIPULATOR VCARE SML CRV RETR (MISCELLANEOUS) IMPLANT
MANIPULATOR VCARE STD CRV RETR (MISCELLANEOUS) IMPLANT
NDL HYPO 22X1.5 SAFETY MO (MISCELLANEOUS) ×2 IMPLANT
NEEDLE HYPO 22X1.5 SAFETY MO (MISCELLANEOUS) ×2 IMPLANT
NS IRRIG 500ML POUR BTL (IV SOLUTION) ×2 IMPLANT
OBTURATOR OPTICAL STND 8 DVNC (TROCAR) ×2 IMPLANT
OBTURATOR OPTICALSTD 8 DVNC (TROCAR) ×2 IMPLANT
OCCLUDER COLPOPNEUMO (BALLOONS) ×2 IMPLANT
PACK GYN LAPAROSCOPIC (MISCELLANEOUS) ×2 IMPLANT
PAD PREP OB/GYN DISP 24X41 (PERSONAL CARE ITEMS) ×2 IMPLANT
SCISSORS MNPLR CVD DVNC XI (INSTRUMENTS) ×2 IMPLANT
SCRUB CHG 4% DYNA-HEX 4OZ (MISCELLANEOUS) ×2 IMPLANT
SEAL UNIV 5-12 XI (MISCELLANEOUS) ×6 IMPLANT
SEALER VESSEL EXT DVNC XI (MISCELLANEOUS) IMPLANT
SET CYSTO W/LG BORE CLAMP LF (SET/KITS/TRAYS/PACK) ×2 IMPLANT
SET TUBE FILTERED XL AIRSEAL (SET/KITS/TRAYS/PACK) ×2 IMPLANT
SLEEVE PROTECTION STRL DISP (MISCELLANEOUS) IMPLANT
SOL ELECTROSURG ANTI STICK (MISCELLANEOUS) ×2 IMPLANT
SOL PREP PVP 2OZ (MISCELLANEOUS) IMPLANT
SOLUTION ELECTROSURG ANTI STCK (MISCELLANEOUS) ×2 IMPLANT
SOLUTION PREP PVP 2OZ (MISCELLANEOUS) ×2 IMPLANT
SPONGE T-LAP 18X18 ~~LOC~~+RFID (SPONGE) IMPLANT
SURGILUBE 2OZ TUBE FLIPTOP (MISCELLANEOUS) ×2 IMPLANT
SUT MNCRL 4-0 27 PS-2 XMFL (SUTURE) ×2 IMPLANT
SUT STRATA PDS 0 30 CT-2.5 (SUTURE) ×2 IMPLANT
SUT STRATAFIX SPIRAL PDS+ 0 30 (SUTURE) IMPLANT
SUT VIC AB 0 CT2 27 (SUTURE) ×2 IMPLANT
SUTURE MNCRL 4-0 27XMF (SUTURE) ×2 IMPLANT
SYR 10ML LL (SYRINGE) ×2 IMPLANT
SYR 50ML LL SCALE MARK (SYRINGE) ×2 IMPLANT
TRAP FLUID SMOKE EVACUATOR (MISCELLANEOUS) ×2 IMPLANT
WATER STERILE IRR 500ML POUR (IV SOLUTION) ×2 IMPLANT

## 2023-06-03 NOTE — Interval H&P Note (Signed)
 History and Physical Interval Note:  06/03/2023 9:42 AM  Sheila Baker  has presented today for surgery, with the diagnosis of menorrhagia with regular cycle right lower quadrant pain.  The various methods of treatment have been discussed with the patient and family. After consideration of risks, benefits and other options for treatment, the patient has consented to  Procedure(s) with comments: HYSTERECTOMY, TOTAL, ROBOT-ASSISTED, LAPAROSCOPIC, WITH BILATERAL SALPINGECTOMY Bilateral) - REMOVAL OF ABNORMAL TISSUE, CYSTOSCOPY (N/A) as a surgical intervention.  The patient's history has been reviewed, patient examined, no change in status, stable for surgery.  I have reviewed the patient's chart and labs.  Questions were answered to the patient's satisfaction.  The original case was mistakenly posted as removal of ovaries.  This is been corrected on the consent form and above.  The patient understands that the only circumstance in which her ovaries would be removed would be in an emergency (high concern for cancer or bleeding that somehow occurs and the only fix would be to remove an ovary.  In either case, this is considered an emergency.   [name], MD, [facility] 06/03/2023 9:44 AM

## 2023-06-03 NOTE — Transfer of Care (Signed)
 Immediate Anesthesia Transfer of Care Note  Patient: Sheila Baker  Procedure(s) Performed: HYSTERECTOMY, TOTAL, LAPAROSCOPIC, ROBOT-ASSISTED WITH SALPINGECTOMY (Bilateral) CYSTOSCOPY (Bladder)  Patient Location: PACU  Anesthesia Type:General  Level of Consciousness: awake and patient cooperative  Airway & Oxygen Therapy: Patient Spontanous Breathing  Post-op Assessment: Report given to RN and Post -op Vital signs reviewed and stable  Post vital signs: stable  Last Vitals:  Vitals Value Taken Time  BP 115/57 06/03/23 1317  Temp 36.2 C 06/03/23 1317  Pulse 79 06/03/23 1320  Resp 20 06/03/23 1320  SpO2 97 % 06/03/23 1320  Vitals shown include unfiled device data.  Last Pain:  Vitals:   06/03/23 0910  TempSrc: Temporal  PainSc: 0-No pain         Complications: No notable events documented.

## 2023-06-03 NOTE — Op Note (Signed)
 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 Feliberto Gottron)  Primary Surgeon: [name], MD  Assistant Surgeon: Erma Pinto Schermerhorn, MC   EBL: 25 mL   IVF: 1,000 mL   Urine output: 400 mL  Specimens:  1) Uterus, cervix, and bilateral fallopian tubes 2) Left ovarian fossa peritoneum biopsy 3) Left uterosacral biopsy 4) Left posterior cul-de-sac peritoneum biopsy 5) Right posterior cul-de-sac peritoneum biopsy  Drains: none  Complications: None   Disposition: PACU   Condition: Stable   Findings:  1) Apparently normal appearing uterus  2) normal appearing ovaries, bilateral fallopian tubes 3) Several areas of stellate lesions, powder burn lesion, most consistent with endometriosis  Procedure Summary:  The patient was taken to the operating room where general anesthesia was administered and found to be adequate. She was placed in the dorsal supine lithotomy position in [place] stirrups and prepped and draped in the usual, sterile fashion. After a timeout was called an indwelling catheter was placed in her bladder.  A sterile speculum was placed in her vagina.  The anterior lip of the cervix was grasped with the single-tooth tenaculum.  The cervix was serially dilated to an 11 Pratt dilator.  The large Vcare device was placed in accordance to the manufacturer's recommendations.  The tenaculum and speculum were removed.   Attention was turned to the abdomen where after injection of local anesthetic, an 8 mm supraumbilical  incision was made with the scalpel. Entry into the abdomen was obtained via Optiview trocar technique (a blunt entry technique with camera visualization through the obturator upon entry). Verification of entry into the abdomen was obtained using opening pressures. The abdomen was insufflated with CO2. The camera was introduced through the trocar with verification of atraumatic entry.  Right and left abdominal entry sites were created after injection of local anesthetic about 8 cm lateral to the umbilical port in accordance with the Intuitive manufacturer's recommendations.  The port sites were 8 mm.  The intuitive trochars were introduced under intra-abdominal camera visualization without difficulty   The XI robot was docked on the patient's left.  Clearance was verified from the patient's legs.  Through the umbilical port the camera was placed.  Through the port attached to arm 4 the monopolar scissors were placed.  Through the port attached to arm 2 the forced bipolar forceps were was placed.     An inspection was undertaken of the pelvis with the above-noted findings.  Several areas concerning for endometriosis implants were noted.  The areas that would not have been included with the removal of the specimen were targeted for removal.  The appendix was visualized and found to be normal-appearing.  There was a small amount of hyperemia at the cecum without apparent inflammation.  The first targeted removal of endometriosis implant was in the left ovarian fossa.  The ureter was identified transperitoneally and was well away from the area of interest.  The peritoneum was tented up and opened sharply with scissors.  A circumferential excision of peritoneum was performed and the specimen was handed off through one of the trocars.  The left uterosacral ligament was next targeted and similarly a circumferential removal of tissue was performed and passed off through the trocar.  In the left posterior cul-de-sac to  areas were included in the same removal.  The peritoneum was tented up and entered sharply.  A circumferential incision was made to encompass both areas.  The underlying tissue was dissected away.  The specimen was handed off to the trocar.  A similar procedure was carried out on the right and clearance from the right ureter was ensured.  The specimen was handed off to the trocar.  The resulting defects in the peritoneum were noted to be hemostatic.    The bilateral ureters were identified and found to be well away from the operative area of interest.  The uterus was retroverted and the bladder flap was created without difficulty.  The right round ligament was grasped cauterized and transected.  The same was performed on the left round ligament.  The right fallopian tube was grasped at the fimbriated end and was transected along the mesosalpinx in a lateral-to-medial fashion.  The same was performed on the left.  The utero-ovarian ligament was cauterized and transected bilaterally.  This was carried to the dissection of the bilateral round ligaments. The broad ligaments were opened posteriorly and anteriorly.  Tissue was divided along the right broad ligament to the level of the internal cervical os.  Bladder tissue was dissected off the lower uterine segment and cervix without difficulty. The right uterine artery was skeletonized and identified and after ligation was transected. The same procedure was carried out on the left side. The colpotomy was performed using monopolar electrocautery in a circumferential fashion following the KOH ring.  The uterus and fallopian tubes and cervix were removed through the vagina.   Closure of the vaginal cuff was undertaken using the 0 V-lock stitch in a running fashion.  All vascular pedicles were inspected and found to be hemostatic after lowering the intra-abdominal pressure to 5 mmHg.  Arista 3 g was placed over all vascular pedicles with ongoing hemostasis noted..  All  instruments removed from the robotic ports.  The robot was undocked from the patient.  The abdomen was then desufflated of CO2 with the aid of 5 deep breaths from anesthesia.  All trochars were then removed.  All skin incisions were closed using 4-0 Vicryl in a subcuticular fashion and reinforced using surgical skin glue.   Cystoscopy was undertaken at this point. The Foley catheter was removed and the 70 cystoscope was gently introduced through the urethra. The bladder survey was undertaken with efflux of urine from both orifices noted. There were no defects noted in the bladder wall. The cystoscope was utilized to mostly empty the bladder, leaving about 100 mL.  A sterile speculum exam was performed to visualize the vaginal cuff which appeared hemostatic.  A digital sweep of the vagina was undertaken to ensure no instruments or sponges remained and this was verified to be clear.  The surgical assistant performed placement of the uterine manipulator and catheter.  She performed uterine manipulation throughout the surgery.  She performed cystoscopy.  The patient tolerated the procedure well.  Sponge, lap, needle, and instrument counts were correct x 2.  VTE prophylaxis: SCDs. Antibiotic prophylaxis: Ancef 2 grams IV prior to skin incision.  The assistant surgeon was an MD due to the lack of availability of another Sales promotion account executive.  She was awakened in the operating room and was taken to the PACU in stable condition.  [name], MD, [facility] 06/03/2023 1:09 PM

## 2023-06-03 NOTE — Anesthesia Preprocedure Evaluation (Addendum)
 Anesthesia Evaluation  Patient identified by MRN, date of birth, ID band Patient awake    Reviewed: Allergy & Precautions, NPO status , Patient's Chart, lab work & pertinent test results  Airway Mallampati: II  TM Distance: >3 FB Neck ROM: Full    Dental  (+) Teeth Intact   Pulmonary neg pulmonary ROS   Pulmonary exam normal        Cardiovascular Exercise Tolerance: Good negative cardio ROS  Rhythm:Regular Rate:Normal     Neuro/Psych  Headaches  Anxiety        GI/Hepatic Neg liver ROS,GERD  Medicated,,  Endo/Other    Renal/GU negative Renal ROS  negative genitourinary   Musculoskeletal   Abdominal   Peds negative pediatric ROS (+)  Hematology negative hematology ROS (+)   Anesthesia Other Findings   Reproductive/Obstetrics                             Anesthesia Physical Anesthesia Plan  ASA: 1  Anesthesia Plan: General   Post-op Pain Management:    Induction: Intravenous  PONV Risk Score and Plan: 1 and Ondansetron and Dexamethasone  Airway Management Planned: Oral ETT  Additional Equipment:   Intra-op Plan:   Post-operative Plan: Extubation in OR  Informed Consent:   Plan Discussed with: CRNA  Anesthesia Plan Comments:        Anesthesia Quick Evaluation

## 2023-06-03 NOTE — Anesthesia Procedure Notes (Signed)
 Procedure Name: Intubation Date/Time: 06/03/2023 10:53 AM  Performed by: Maryla Morrow., CRNAPre-anesthesia Checklist: Patient identified, Patient being monitored, Timeout performed, Emergency Drugs available and Suction available Patient Re-evaluated:Patient Re-evaluated prior to induction Oxygen Delivery Method: Circle system utilized Preoxygenation: Pre-oxygenation with 100% oxygen Induction Type: IV induction Ventilation: Mask ventilation without difficulty Laryngoscope Size: 3 and McGrath Grade View: Grade I Tube type: Oral Tube size: 7.0 mm Number of attempts: 1 Airway Equipment and Method: Stylet Placement Confirmation: ETT inserted through vocal cords under direct vision, positive ETCO2 and breath sounds checked- equal and bilateral Secured at: 20 cm Tube secured with: Tape Dental Injury: Teeth and Oropharynx as per pre-operative assessment

## 2023-06-04 ENCOUNTER — Encounter: Payer: Self-pay | Admitting: Obstetrics and Gynecology

## 2023-06-04 NOTE — Anesthesia Postprocedure Evaluation (Signed)
 Anesthesia Post Note  Patient: Sheila Baker  Procedure(s) Performed: HYSTERECTOMY, TOTAL, LAPAROSCOPIC, ROBOT-ASSISTED WITH SALPINGECTOMY (Bilateral) CYSTOSCOPY (Bladder)  Patient location during evaluation: PACU Anesthesia Type: General Level of consciousness: awake and awake and alert Pain management: satisfactory to patient Vital Signs Assessment: post-procedure vital signs reviewed and stable Respiratory status: spontaneous breathing Cardiovascular status: stable Anesthetic complications: no   There were no known notable events for this encounter.   Last Vitals:  Vitals:   06/03/23 1436 06/03/23 1542  BP: 117/68 118/73  Pulse: (!) 51 (!) 56  Resp: 14 16  Temp: 36.5 C   SpO2: 100% 100%    Last Pain:  Vitals:   06/03/23 1542  TempSrc:   PainSc: 3                  VAN STAVEREN,Dymphna Wadley

## 2023-06-07 LAB — SURGICAL PATHOLOGY

## 2023-08-25 ENCOUNTER — Encounter: Payer: Self-pay | Admitting: Family Medicine

## 2023-08-25 DIAGNOSIS — Z1231 Encounter for screening mammogram for malignant neoplasm of breast: Secondary | ICD-10-CM

## 2023-09-02 ENCOUNTER — Other Ambulatory Visit: Payer: Self-pay | Admitting: Family Medicine

## 2023-09-02 DIAGNOSIS — Z1231 Encounter for screening mammogram for malignant neoplasm of breast: Secondary | ICD-10-CM

## 2023-09-06 ENCOUNTER — Other Ambulatory Visit: Payer: Self-pay | Admitting: Family Medicine

## 2023-09-06 DIAGNOSIS — Z1231 Encounter for screening mammogram for malignant neoplasm of breast: Secondary | ICD-10-CM

## 2023-09-07 ENCOUNTER — Other Ambulatory Visit: Payer: Self-pay | Admitting: Internal Medicine

## 2023-09-07 DIAGNOSIS — N644 Mastodynia: Secondary | ICD-10-CM

## 2023-09-09 ENCOUNTER — Ambulatory Visit
Admission: RE | Admit: 2023-09-09 | Discharge: 2023-09-09 | Disposition: A | Discharge status: Home / Self Care | Source: Ambulatory Visit | Attending: Internal Medicine | Admitting: Internal Medicine

## 2023-09-09 DIAGNOSIS — N644 Mastodynia: Secondary | ICD-10-CM

## 2024-04-09 IMAGING — MG MM BREAST BX W/ LOC DEV EA AD LESION IMAG BX SPEC STEREO GUIDE*R
6 series · 6 of 6 positions shown · non-contrast
Comparison: Previous exams.
COMPARISON: Previous exams.

Addendum:
CLINICAL DATA: 40-year-old female with indeterminate right breast
calcifications.

EXAM:
RIGHT BREAST STEREOTACTIC CORE NEEDLE BIOPSY

[R (1 of 6)]
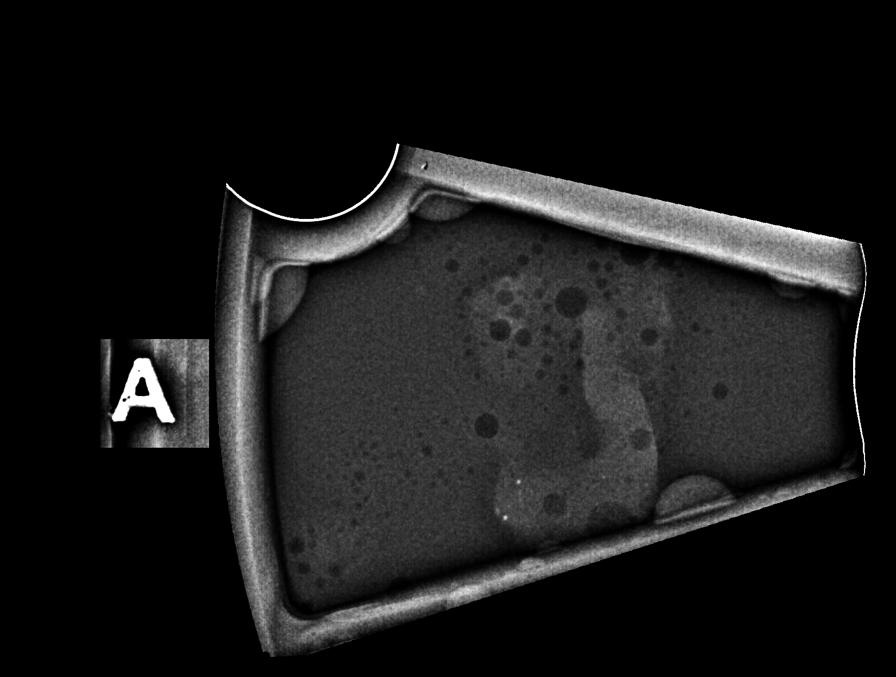

[R (2 of 6)]
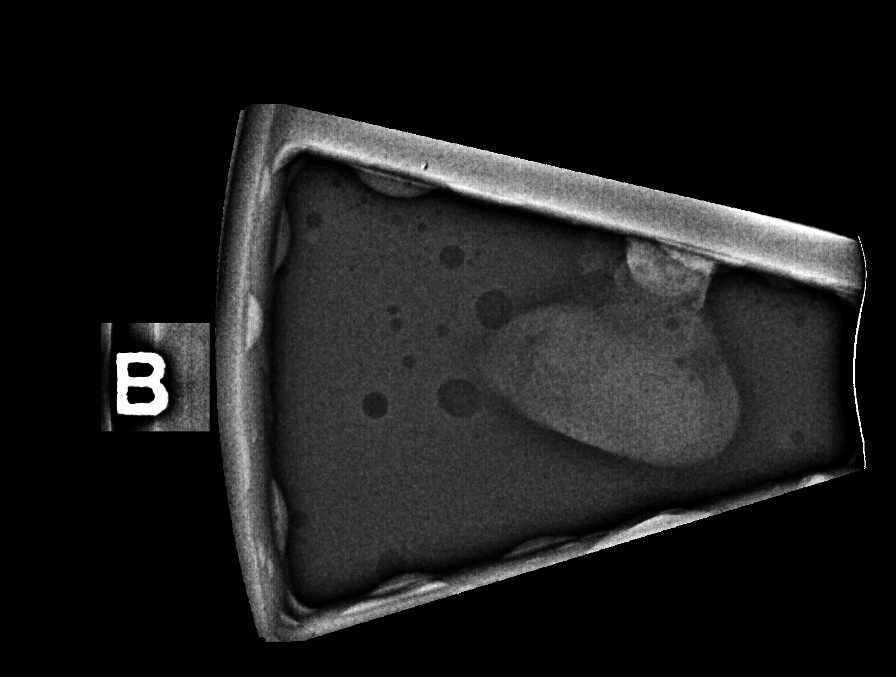

[R (3 of 6)]
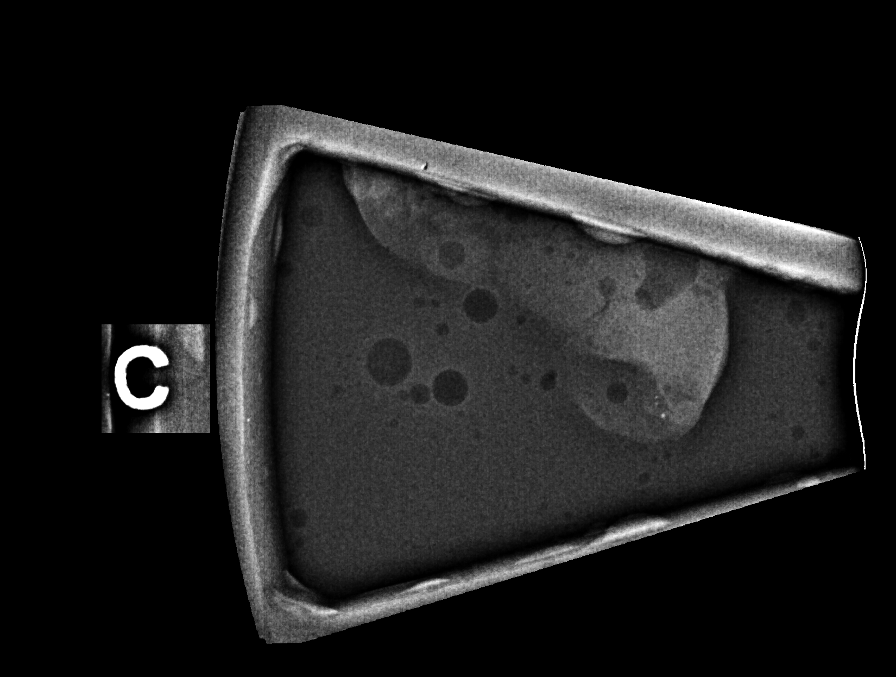

[R (4 of 6)]
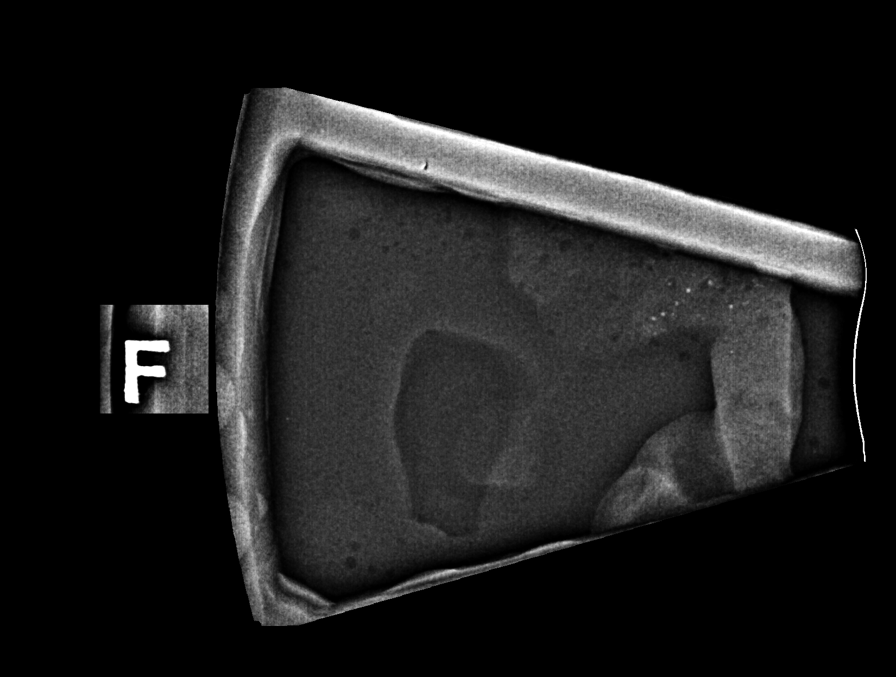

[R (5 of 6)]
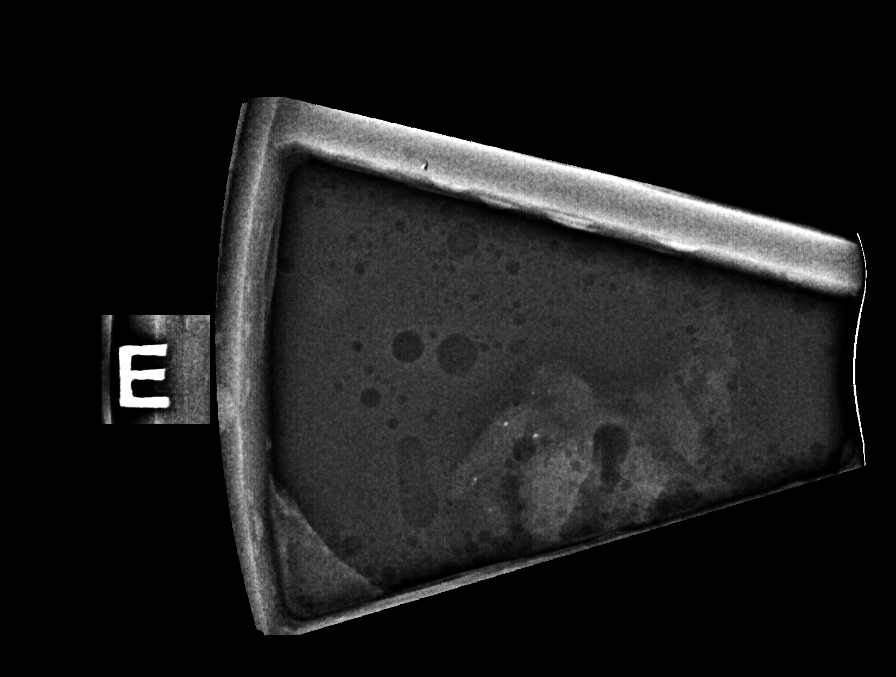

[R (6 of 6)]
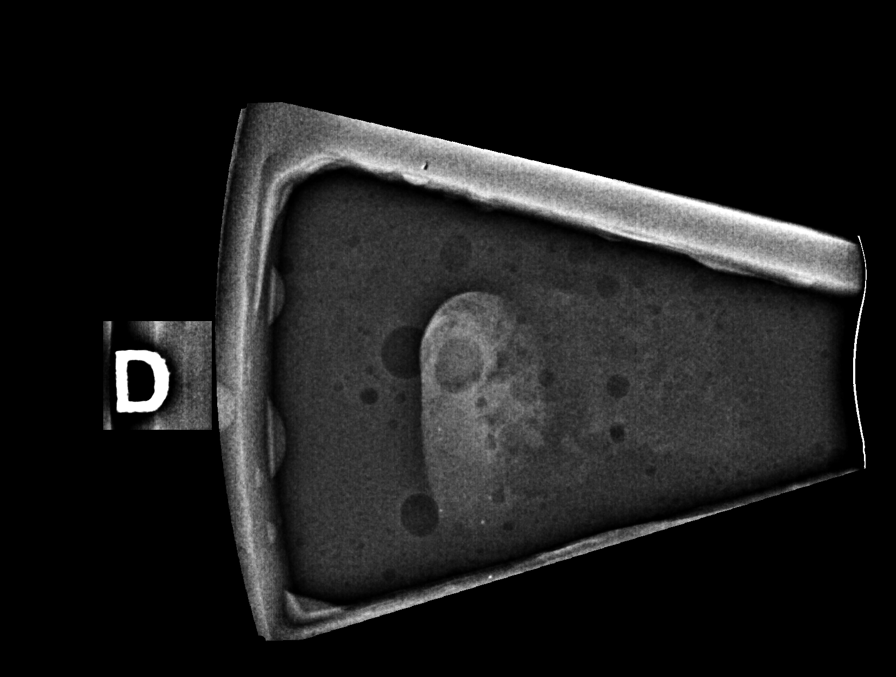

[6 of 6 positions shown; findings below may reference images not displayed]



Lesion quadrant: Upper outer quadrant

Using sterile technique and 1% Lidocaine as local anesthetic, under
stereotactic guidance, a 9 gauge vacuum assisted device was used to
perform core needle biopsy of calcifications in the upper-outer
quadrant of the right breast at posterior depth using a superior
approach. Specimen radiograph was performed showing scattered
calcifications within the specimen. Specimens with calcifications
are identified for pathology. At the conclusion of the procedure, a
ribbon shaped tissue marker clip was deployed into the biopsy
cavity.

Lesion quadrant: Upper outer quadrant

Using sterile technique and 1% Lidocaine as local anesthetic, under
stereotactic guidance, a 9 gauge vacuum assisted device was used to
perform core needle biopsy of calcifications in the upper outer
quadrant at middle depth using a superior approach. Specimen
radiograph was performed showing several calcifications within
multiple specimens. Specimens with calcifications are identified for
pathology. At the conclusion of the procedure, an X shaped tissue
marker clip was deployed into the biopsy cavity.

Follow-up 2-view mammogram was performed and dictated separately.
IMPRESSION: Stereotactic-guided biopsy of the right breast x2. No apparent
complications.

ADDENDUM:
PATHOLOGY revealed: Site A. BREAST, RIGHT, UPPER OUTER QUADRANT
POSTERIOR DEPTH (RIBBON CLIP); STEREOTACTIC-GUIDED CORE BIOPSY: -
LOBULAR CALCIFICATIONS, SEE COMMENT. - NEGATIVE FOR ATYPIA AND
MALIGNANCY.

Pathology results are CONCORDANT with imaging findings, per Dr.
Osmie Saleem.

PATHOLOGY revealed: Site B. BREAST, RIGHT, UPPER OUTER QUADRANT
MIDDLE DEPTH (X CLIP); STEREOTACTIC-GUIDED CORE BIOPSY: - LOBULAR
CALCIFICATIONS. - FOCAL USUAL DUCTAL HYPERPLASIA, PSEUDOANGIOMATOUS
STROMAL HYPERPLASIA (PASH), AND APOCRINE CYSTS. - NEGATIVE FOR
ATYPIA AND MALIGNANCY.

Comment: In A and B, there are many calcifications within lobules or
immediately adjacent to lobules. They vary in size and are mostly
too small for detection on imaging (less than 50 microns), with
scattered calcifications between 50 and 125 microns. There are
sparse lobular infiltrates of lymphocytes and plasma cells, without
evidence of stromal sclerosis. The changes are favored to be
involutional, and do not appear to be associated with epithelial
proliferation. Correlation with history and imaging findings is
recommended.

Pathology results are CONCORDANT with imaging findings, per Dr.
Osmie Saleem.

Pathology results and recommendations below were discussed with
patient by telephone on 06/19/2021. Patient reported biopsy site
within normal limits with slight tenderness at the site. Post biopsy
care instructions were reviewed, questions were answered and my
direct phone number was provided to patient. Patient was instructed
to call [HOSPITAL] if any concerns or questions arise
related to the biopsy.

RECOMMENDATION: Patient instructed to return in six months for
unilateral RIGHT breast diagnostic mammogram (with magnified views)
to ensure stability of biopsied area. Patient informed a reminder
notice will be sent regarding this appointment and she will need to
call mammography site to schedule this appointment.

Pathology results reported by Mie Martz RN on 06/19/2021.



Lesion quadrant: Upper outer quadrant

Using sterile technique and 1% Lidocaine as local anesthetic, under
stereotactic guidance, a 9 gauge vacuum assisted device was used to
perform core needle biopsy of calcifications in the upper-outer
quadrant of the right breast at posterior depth using a superior
approach. Specimen radiograph was performed showing scattered
calcifications within the specimen. Specimens with calcifications
are identified for pathology. At the conclusion of the procedure, a
ribbon shaped tissue marker clip was deployed into the biopsy
cavity.

Lesion quadrant: Upper outer quadrant

Using sterile technique and 1% Lidocaine as local anesthetic, under
stereotactic guidance, a 9 gauge vacuum assisted device was used to
perform core needle biopsy of calcifications in the upper outer
quadrant at middle depth using a superior approach. Specimen
radiograph was performed showing several calcifications within
multiple specimens. Specimens with calcifications are identified for
pathology. At the conclusion of the procedure, an X shaped tissue
marker clip was deployed into the biopsy cavity.

Follow-up 2-view mammogram was performed and dictated separately.
IMPRESSION: Stereotactic-guided biopsy of the right breast x2. No apparent
complications.
